# Patient Record
Sex: Male | Born: 1955 | Race: White | Hispanic: No | Marital: Married
Health system: Southern US, Community
[De-identification: ages and names within clinical notes are randomized; demographics above are authoritative.]

---

## 2009-06-24 ENCOUNTER — Inpatient Hospital Stay (HOSPITAL_COMMUNITY): Admission: EM | Admit: 2009-06-24 | Discharge: 2009-06-27 | Payer: Self-pay | Admitting: Emergency Medicine

## 2009-08-05 ENCOUNTER — Encounter: Admission: RE | Admit: 2009-08-05 | Discharge: 2009-08-05 | Payer: Self-pay | Admitting: Internal Medicine

## 2010-08-23 ENCOUNTER — Encounter
Admission: RE | Admit: 2010-08-23 | Discharge: 2010-08-23 | Payer: Self-pay | Source: Home / Self Care | Attending: Internal Medicine | Admitting: Internal Medicine

## 2010-10-23 ENCOUNTER — Emergency Department (HOSPITAL_COMMUNITY): Payer: BC Managed Care – PPO

## 2010-10-23 ENCOUNTER — Emergency Department (HOSPITAL_COMMUNITY)
Admission: EM | Admit: 2010-10-23 | Discharge: 2010-10-23 | Disposition: A | Discharge status: Home / Self Care | Payer: BC Managed Care – PPO | Attending: Emergency Medicine | Admitting: Emergency Medicine

## 2010-10-23 DIAGNOSIS — R109 Unspecified abdominal pain: Secondary | ICD-10-CM | POA: Insufficient documentation

## 2010-10-23 DIAGNOSIS — N201 Calculus of ureter: Secondary | ICD-10-CM | POA: Insufficient documentation

## 2010-10-23 LAB — URINALYSIS, ROUTINE W REFLEX MICROSCOPIC
Leukocytes, UA: NEGATIVE
Protein, ur: NEGATIVE mg/dL
Specific Gravity, Urine: 1.026 (ref 1.005–1.030)
Urobilinogen, UA: 0.2 mg/dL (ref 0.0–1.0)

## 2010-10-23 LAB — DIFFERENTIAL
Basophils Absolute: 0 10*3/uL (ref 0.0–0.1)
Eosinophils Absolute: 0 10*3/uL (ref 0.0–0.7)
Lymphocytes Relative: 4 % — ABNORMAL LOW (ref 12–46)
Lymphs Abs: 0.5 10*3/uL — ABNORMAL LOW (ref 0.7–4.0)
Neutro Abs: 11.7 10*3/uL — ABNORMAL HIGH (ref 1.7–7.7)
Neutrophils Relative %: 92 % — ABNORMAL HIGH (ref 43–77)

## 2010-10-23 LAB — CBC
Hemoglobin: 14.8 g/dL (ref 13.0–17.0)
MCH: 32.9 pg (ref 26.0–34.0)
RBC: 4.5 MIL/uL (ref 4.22–5.81)
WBC: 12.7 10*3/uL — ABNORMAL HIGH (ref 4.0–10.5)

## 2010-10-23 LAB — POCT I-STAT, CHEM 8
Potassium: 4.4 mEq/L (ref 3.5–5.1)
Sodium: 141 mEq/L (ref 135–145)

## 2010-10-23 LAB — URINE MICROSCOPIC-ADD ON

## 2010-12-01 LAB — COMPREHENSIVE METABOLIC PANEL
ALT: 100 U/L — ABNORMAL HIGH (ref 0–53)
AST: 82 U/L — ABNORMAL HIGH (ref 0–37)
Albumin: 3.3 g/dL — ABNORMAL LOW (ref 3.5–5.2)
BUN: 10 mg/dL (ref 6–23)
CO2: 29 mEq/L (ref 19–32)
Calcium: 8 mg/dL — ABNORMAL LOW (ref 8.4–10.5)
Chloride: 103 mEq/L (ref 96–112)
Chloride: 98 mEq/L (ref 96–112)
Creatinine, Ser: 1.28 mg/dL (ref 0.4–1.5)
GFR calc Af Amer: >60 mL/min (ref >60)
GFR calc non Af Amer: 59 mL/min — ABNORMAL LOW (ref >60)
Glucose, Bld: 106 mg/dL — ABNORMAL HIGH (ref 70–99)
Potassium: 3.4 mEq/L — ABNORMAL LOW (ref 3.5–5.1)
Sodium: 139 mEq/L (ref 135–145)
Total Bilirubin: 1.5 mg/dL — ABNORMAL HIGH (ref 0.3–1.2)
Total Protein: 5.9 g/dL — ABNORMAL LOW (ref 6.0–8.3)

## 2010-12-01 LAB — URINALYSIS, ROUTINE W REFLEX MICROSCOPIC
Bilirubin Urine: NEGATIVE
Glucose, UA: NEGATIVE mg/dL
Ketones, ur: NEGATIVE mg/dL
Nitrite: NEGATIVE
Protein, ur: NEGATIVE mg/dL
Urobilinogen, UA: 4 mg/dL — ABNORMAL HIGH (ref 0.0–1.0)

## 2010-12-01 LAB — CBC
HCT: 33.5 % — ABNORMAL LOW (ref 39.0–52.0)
HCT: 34.1 % — ABNORMAL LOW (ref 39.0–52.0)
Hemoglobin: 11.6 g/dL — ABNORMAL LOW (ref 13.0–17.0)
Hemoglobin: 13.2 g/dL (ref 13.0–17.0)
MCHC: 34.1 g/dL (ref 30.0–36.0)
MCV: 96.6 fL (ref 78.0–100.0)
MCV: 97.4 fL (ref 78.0–100.0)
MCV: 97.5 fL (ref 78.0–100.0)
Platelets: 255 10*3/uL (ref 150–400)
RDW: 12.6 % (ref 11.5–15.5)
WBC: 15.2 10*3/uL — ABNORMAL HIGH (ref 4.0–10.5)
WBC: 8.3 10*3/uL (ref 4.0–10.5)

## 2010-12-01 LAB — BASIC METABOLIC PANEL
CO2: 29 mEq/L (ref 19–32)
Calcium: 8.5 mg/dL (ref 8.4–10.5)
Chloride: 102 mEq/L (ref 96–112)
Creatinine, Ser: 1.05 mg/dL (ref 0.4–1.5)
GFR calc Af Amer: >60 mL/min (ref >60)
GFR calc Af Amer: >60 mL/min (ref >60)
GFR calc non Af Amer: >60 mL/min (ref >60)
Sodium: 136 mEq/L (ref 135–145)

## 2010-12-01 LAB — DIFFERENTIAL
Basophils Relative: 0 % (ref 0–1)
Eosinophils Absolute: 0.3 10*3/uL (ref 0.0–0.7)
Eosinophils Relative: 2 % (ref 0–5)
Lymphs Abs: 1.3 10*3/uL (ref 0.7–4.0)

## 2010-12-01 LAB — HEPATIC FUNCTION PANEL
ALT: 69 U/L — ABNORMAL HIGH (ref 0–53)
ALT: 73 U/L — ABNORMAL HIGH (ref 0–53)
Albumin: 3.1 g/dL — ABNORMAL LOW (ref 3.5–5.2)
Alkaline Phosphatase: 115 U/L (ref 39–117)
Alkaline Phosphatase: 155 U/L — ABNORMAL HIGH (ref 39–117)
Bilirubin, Direct: 0.4 mg/dL — ABNORMAL HIGH (ref 0.0–0.3)
Total Bilirubin: 1 mg/dL (ref 0.3–1.2)
Total Protein: 5.8 g/dL — ABNORMAL LOW (ref 6.0–8.3)

## 2011-07-06 ENCOUNTER — Other Ambulatory Visit: Payer: Self-pay | Admitting: Internal Medicine

## 2011-07-06 DIAGNOSIS — E041 Nontoxic single thyroid nodule: Secondary | ICD-10-CM

## 2011-07-31 ENCOUNTER — Ambulatory Visit
Admission: RE | Admit: 2011-07-31 | Discharge: 2011-07-31 | Disposition: A | Discharge status: Home / Self Care | Payer: BC Managed Care – PPO | Source: Ambulatory Visit | Attending: Internal Medicine | Admitting: Internal Medicine

## 2011-07-31 DIAGNOSIS — E041 Nontoxic single thyroid nodule: Secondary | ICD-10-CM

## 2012-03-11 IMAGING — CT CT ABD-PELV W/O CM
2 of 4 series · 17 of 46 positions shown, 19 images · non-contrast
Comparison: None.

CLINICAL DATA: Right flank pain.  Nausea and vomiting.  Hematuria.

CT ABDOMEN AND PELVIS WITHOUT CONTRAST
TECHNIQUE: Multidetector CT imaging of the abdomen and pelvis was
performed following the standard protocol without intravenous
contrast.

[Series 2: stone_wo 5.0 b40f st · axial · 0.75mm/px · z∈[-509,-89]mm · 14 of 92 slices shown, 16 images]
[im 4/92  soft-tissue]
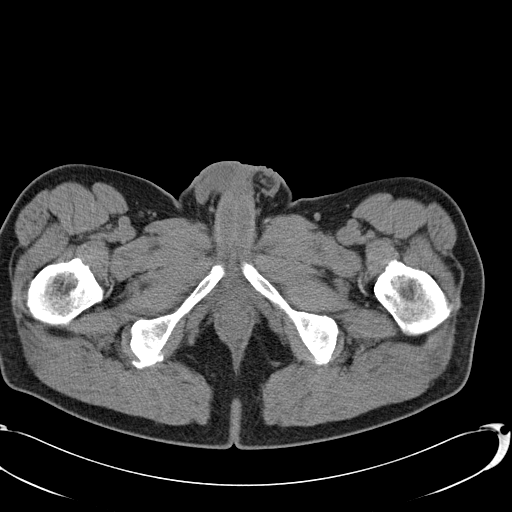
[im 4/92  bone]
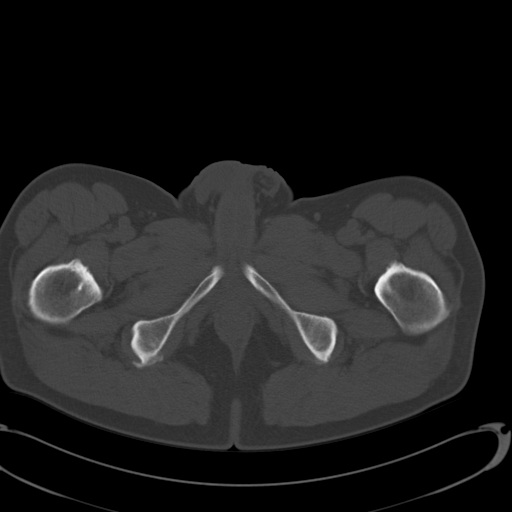
[im 11/92  soft-tissue]
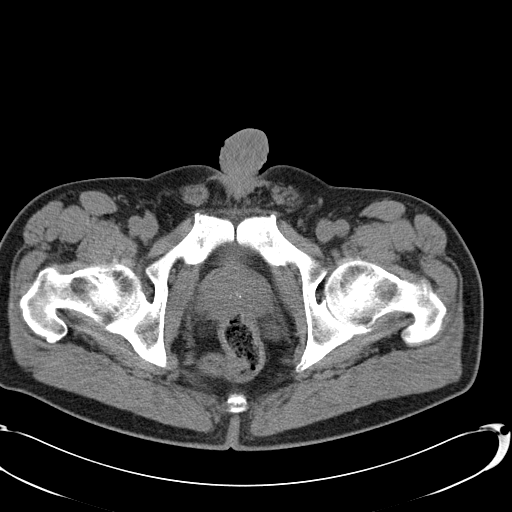
[im 19/92  soft-tissue]
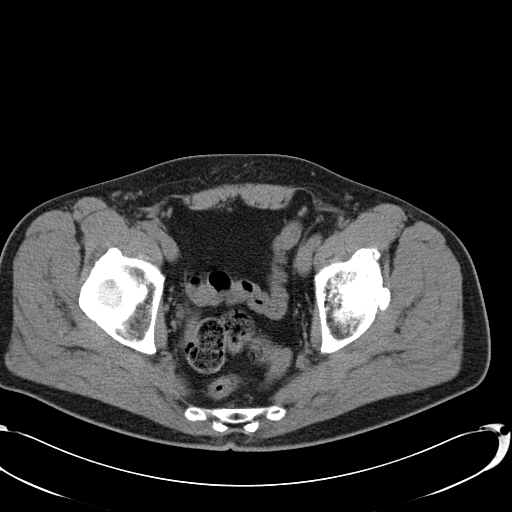
[im 26/92  soft-tissue]
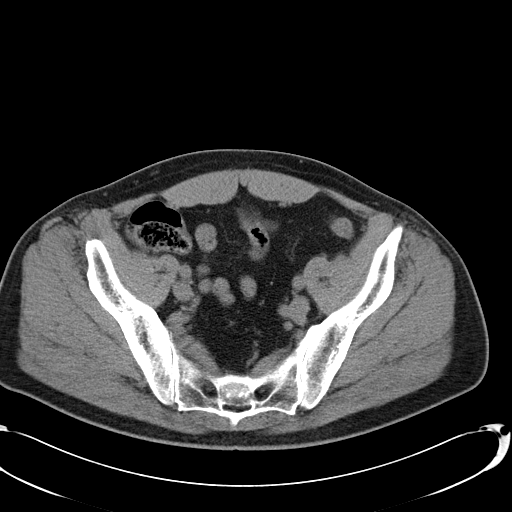
[im 30/92  soft-tissue]
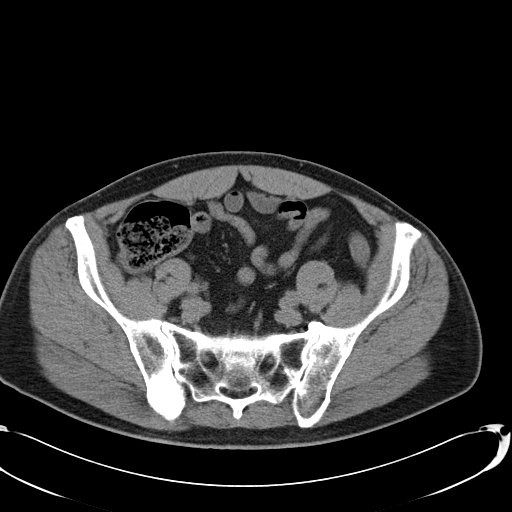
[im 37/92  soft-tissue]
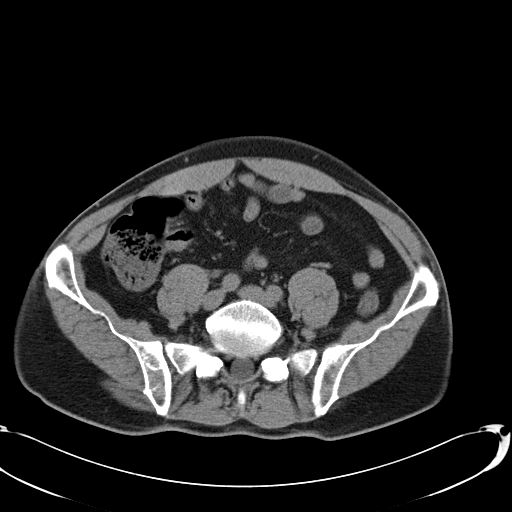
[im 44/92  soft-tissue]
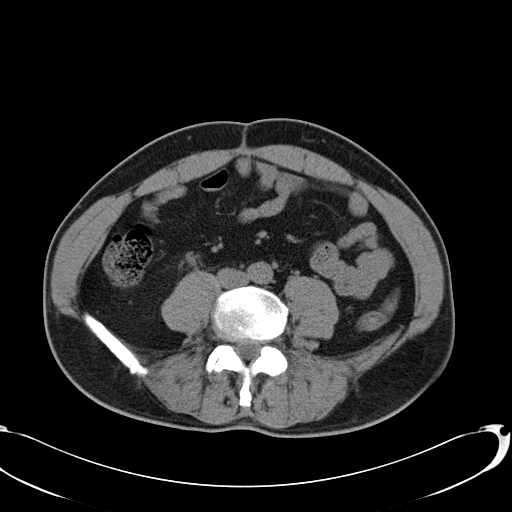
[im 48/92  soft-tissue]
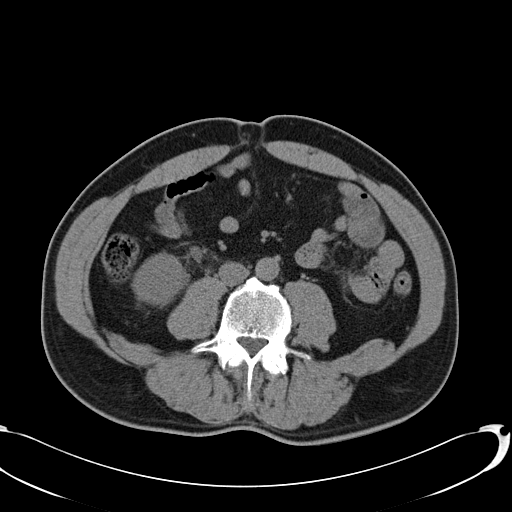
[im 55/92  soft-tissue]
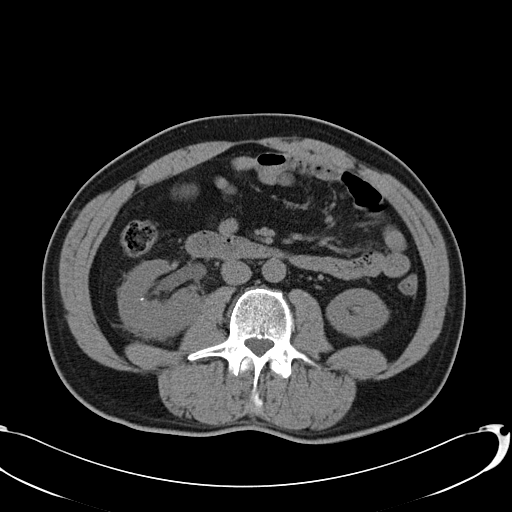
[im 55/92  bone]
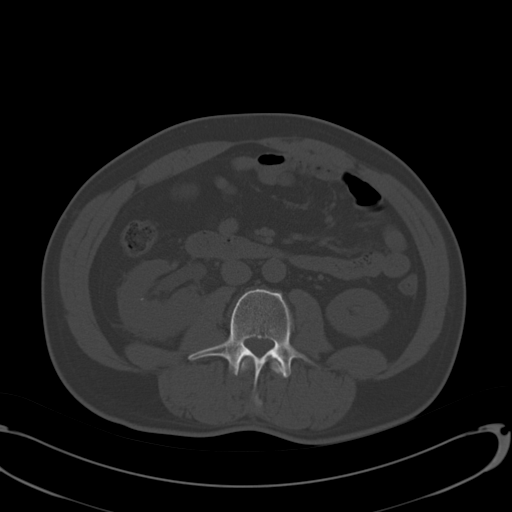
[im 62/92  soft-tissue]
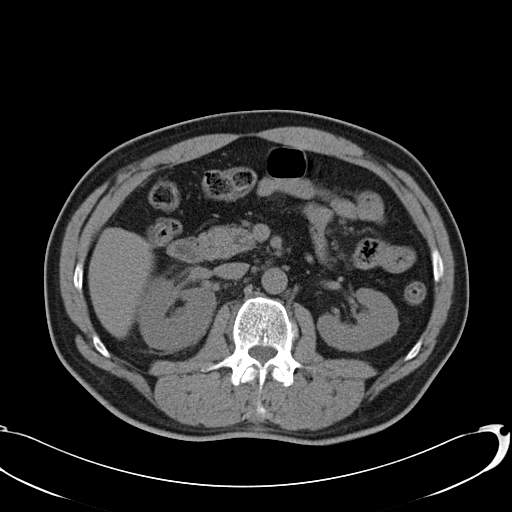
[im 70/92  soft-tissue]
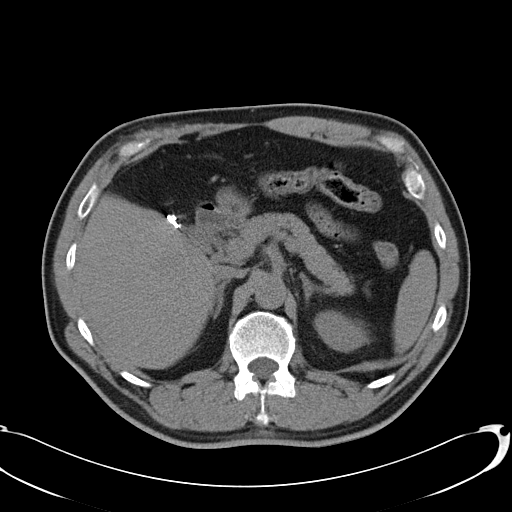
[im 73/92  soft-tissue]
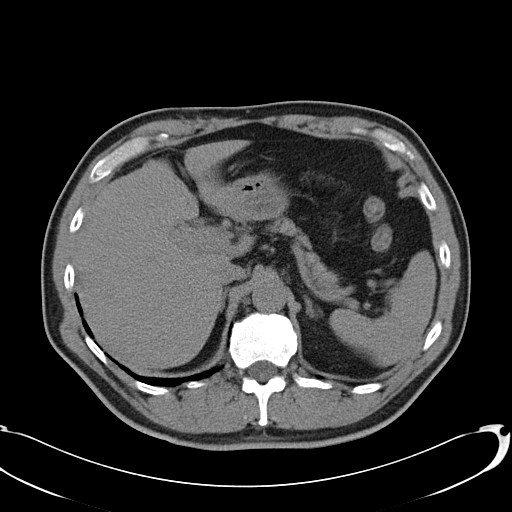
[im 81/92  soft-tissue]
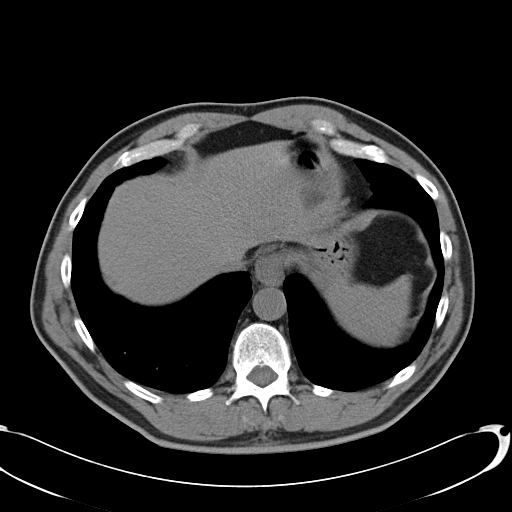
[im 88/92  soft-tissue]
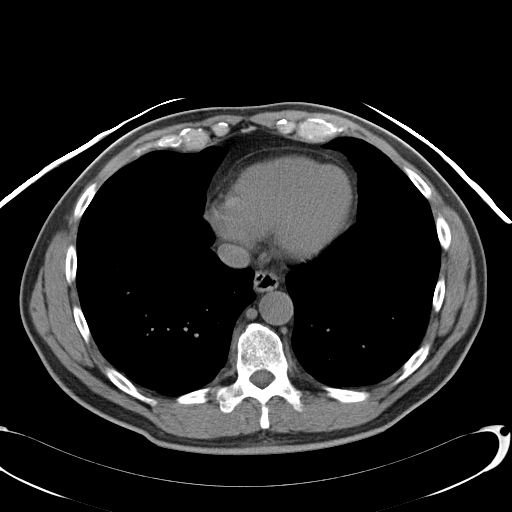

[Series 602: coronal abdomen · coronal · 0.93mm/px · 3 of 127 slices shown]
[im 43/127  soft-tissue]
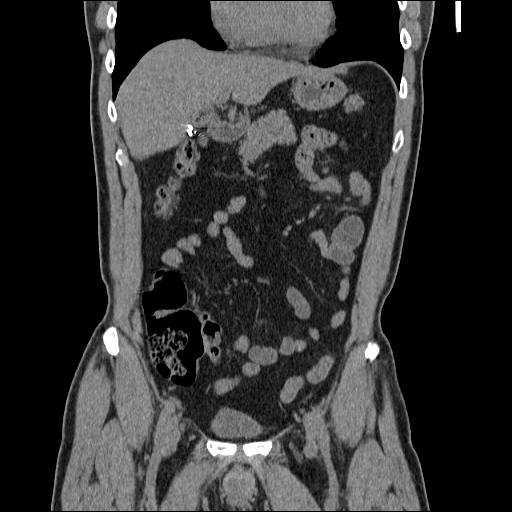
[im 57/127  soft-tissue]
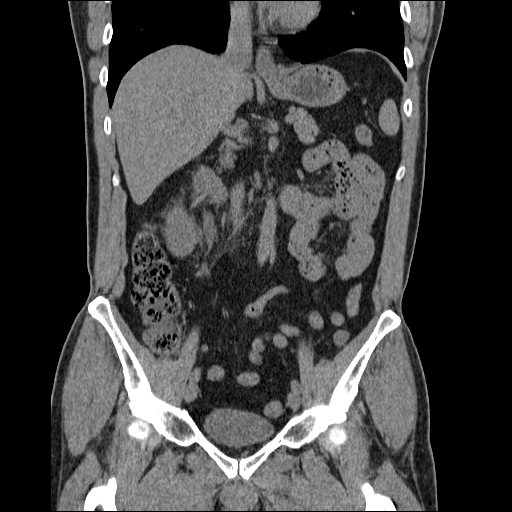
[im 71/127  soft-tissue]
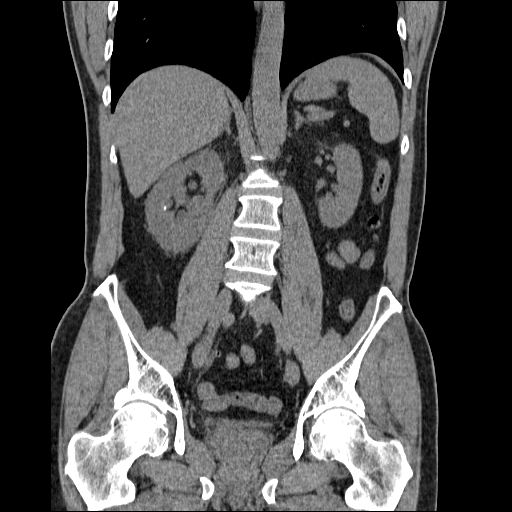

[17 of 46 positions shown; findings below may reference images not displayed]

FINDINGS: 3-day 3 mm calculus is seen at the right ureterovesicle
junction.  This causes mild to moderate right-sided hydronephrosis
and ureterectasis.  Mild right perinephric stranding is also noted.

Tiny 2-3 mm nonobstructing calculi are seen in the mid poles of
both kidneys.  No evidence of left-sided hydronephrosis.  The other
abdominal parenchymal organs have a normal appearance on this
noncontrast study.  Surgical clips are seen from prior
cholecystectomy.

No soft tissue masses or lymphadenopathy are identified.  On this
five bowel is normal in appearance.  Scattered diverticula are seen
in the descending sigmoid colon.  No inflammatory process or
abnormal fluid collections are identified.
IMPRESSION: 1.  Mild  right hydroureteronephrosis due to 3 mm calculus at the
ureterovesicle junction.
2.  Tiny nonobstructing bilateral intrarenal calculi.

## 2014-10-20 ENCOUNTER — Other Ambulatory Visit: Payer: Self-pay | Admitting: Internal Medicine

## 2014-10-20 DIAGNOSIS — E041 Nontoxic single thyroid nodule: Secondary | ICD-10-CM

## 2014-11-03 ENCOUNTER — Other Ambulatory Visit: Payer: Self-pay

## 2014-11-19 ENCOUNTER — Other Ambulatory Visit: Payer: Self-pay

## 2014-11-25 ENCOUNTER — Ambulatory Visit
Admission: RE | Admit: 2014-11-25 | Discharge: 2014-11-25 | Disposition: A | Discharge status: Home / Self Care | Payer: BLUE CROSS/BLUE SHIELD | Source: Ambulatory Visit | Attending: Internal Medicine | Admitting: Internal Medicine

## 2014-11-25 DIAGNOSIS — E041 Nontoxic single thyroid nodule: Secondary | ICD-10-CM

## 2015-09-09 MED FILL — ENBREL 50 MG/ML SURECLICK S: 50 | 28 days supply | Qty: 4 | Fill #0

## 2015-09-21 MED FILL — METHOTREXATE 2.5 MG TABLET: 2.5 | 28 days supply | Qty: 16 | Fill #0

## 2015-10-06 MED FILL — ENBREL 50 MG/ML SURECLICK S: 50 | 28 days supply | Qty: 4 | Fill #1

## 2015-10-21 DIAGNOSIS — Z125 Encounter for screening for malignant neoplasm of prostate: Secondary | ICD-10-CM | POA: Diagnosis not present

## 2015-10-21 DIAGNOSIS — I10 Essential (primary) hypertension: Secondary | ICD-10-CM | POA: Diagnosis not present

## 2015-10-21 DIAGNOSIS — Z7982 Long term (current) use of aspirin: Secondary | ICD-10-CM | POA: Diagnosis not present

## 2015-10-21 DIAGNOSIS — Z0000 Encounter for general adult medical examination without abnormal findings: Secondary | ICD-10-CM | POA: Diagnosis not present

## 2015-10-27 DIAGNOSIS — Z8042 Family history of malignant neoplasm of prostate: Secondary | ICD-10-CM | POA: Diagnosis not present

## 2015-10-27 DIAGNOSIS — Z1212 Encounter for screening for malignant neoplasm of rectum: Secondary | ICD-10-CM | POA: Diagnosis not present

## 2015-10-27 DIAGNOSIS — Z0000 Encounter for general adult medical examination without abnormal findings: Secondary | ICD-10-CM | POA: Diagnosis not present

## 2015-10-27 DIAGNOSIS — E042 Nontoxic multinodular goiter: Secondary | ICD-10-CM | POA: Diagnosis not present

## 2015-10-27 DIAGNOSIS — I10 Essential (primary) hypertension: Secondary | ICD-10-CM | POA: Diagnosis not present

## 2015-10-27 MED FILL — LISINOPRIL-HCTZ 20-12.5 MG: 20-12.5 | 90 days supply | Qty: 90 | Fill #0

## 2015-10-27 MED FILL — ATORVASTATIN 40 MG TABLET: 40 | 90 days supply | Qty: 90 | Fill #0

## 2015-11-02 MED FILL — ENBREL 50 MG/ML SURECLICK S: 50 | 28 days supply | Qty: 4 | Fill #2

## 2015-11-02 MED FILL — METHOTREXATE 2.5 MG TABLET: 2.5 | 28 days supply | Qty: 16 | Fill #1

## 2015-11-10 DIAGNOSIS — M0589 Other rheumatoid arthritis with rheumatoid factor of multiple sites: Secondary | ICD-10-CM | POA: Diagnosis not present

## 2015-11-10 DIAGNOSIS — M7989 Other specified soft tissue disorders: Secondary | ICD-10-CM | POA: Diagnosis not present

## 2015-11-10 DIAGNOSIS — M150 Primary generalized (osteo)arthritis: Secondary | ICD-10-CM | POA: Diagnosis not present

## 2015-11-10 MED FILL — FOLIC ACID 1 MG TABLET: 1 | 90 days supply | Qty: 90 | Fill #0

## 2015-11-29 MED FILL — METHOTREXATE 2.5 MG TABLET: 2.5 | 28 days supply | Qty: 16 | Fill #2

## 2015-11-29 MED FILL — ENBREL 50 MG/ML SURECLICK S: 50 | 28 days supply | Qty: 4 | Fill #3

## 2015-12-20 MED FILL — CIPROFLOXACIN HCL 500 MG TA: 500 | 14 days supply | Qty: 28 | Fill #0

## 2015-12-27 MED FILL — METHOTREXATE 2.5 MG TABLET: 2.5 | 90 days supply | Qty: 48 | Fill #0

## 2015-12-27 MED FILL — ENBREL 50 MG/ML SURECLICK S: 50 | 28 days supply | Qty: 4 | Fill #4

## 2016-01-31 MED FILL — LISINOPRIL-HCTZ 20-12.5 MG: 20-12.5 | 90 days supply | Qty: 90 | Fill #1

## 2016-01-31 MED FILL — ENBREL 50 MG/ML SURECLICK S: 50 | 28 days supply | Qty: 4 | Fill #5

## 2016-02-04 ENCOUNTER — Telehealth: Payer: Self-pay | Admitting: Pharmacist

## 2016-02-04 NOTE — Telephone Encounter (Signed)
Called patient to schedule appointment with the [facility]. Unable to reach patient but left a HIPAA-compliant message requesting that he return my call.

## 2016-02-09 ENCOUNTER — Ambulatory Visit: Payer: 59 | Attending: Internal Medicine | Admitting: Pharmacist

## 2016-02-09 DIAGNOSIS — M069 Rheumatoid arthritis, unspecified: Secondary | ICD-10-CM

## 2016-02-09 NOTE — Progress Notes (Signed)
S: Patient presents today to the [facility].  Patient is currently taking Enbrel for rheumatoid arthritis. Patient is managed by Dr. Dierdre Forth for this.   Adherence: denies any missed doses  Dosing: 50 mg subQ weekly and rotates sites. Patient also on methotrexate and folic acid.   Drug-drug interactions: none  Monitoring: CBC: monitored every 3 months by Dr. Dierdre Forth S/sx of infection: denies Injection site reactions: denies S/sx of malignancy: denies   O:     Lab Results  Component Value Date   WBC 12.7* 10/23/2010   HGB 15.0 10/23/2010   HCT 44.0 10/23/2010   MCV 95.1 10/23/2010   PLT  10/23/2010    PLATELET CLUMPS NOTED ON SMEAR, COUNT APPEARS ADEQUATE      Chemistry      Component Value Date/Time   NA 141 10/23/2010 1320   K 4.4 10/23/2010 1320   CL 107 10/23/2010 1320   CO2 30 06/27/2009 0410   BUN 24* 10/23/2010 1320   CREATININE 1.6* 10/23/2010 1320      Component Value Date/Time   CALCIUM 8.0* 06/27/2009 0410   ALKPHOS 111 06/27/2009 0410   AST 88* 06/27/2009 0410   ALT 111* 06/27/2009 0410   BILITOT 1.0 06/27/2009 0410     Reviewed labs through [facility] - Hgb/HCT normal, LFTs normal, SCr 1.2 (Feb 2017)  A/P: 1. Medication review: patient is tolerating Enbrel well with no adverse effects and no recent flares. Reviewed Enbrel with him, including the increased risk of infections, decrease in blood cell counts, possible risk of malignancy (unsure if this is due to the drug or due to the disease state), and injection site reactions. No recommendations for changes.    Juanita Craver, PharmD, BCPS, CPP Clinical Pharmacist Practitioner  [facility] [phone]

## 2016-02-10 DIAGNOSIS — M0589 Other rheumatoid arthritis with rheumatoid factor of multiple sites: Secondary | ICD-10-CM | POA: Diagnosis not present

## 2016-02-28 MED FILL — FOLIC ACID 1 MG TABLET: 1 | 90 days supply | Qty: 90 | Fill #1

## 2016-03-01 ENCOUNTER — Other Ambulatory Visit: Payer: Self-pay | Admitting: Pharmacist

## 2016-03-01 MED ORDER — ETANERCEPT 50 MG/ML ~~LOC~~ SOAJ: 50.0000 mg | SUBCUTANEOUS | Status: DC

## 2016-03-01 MED FILL — ENBREL 50 MG/ML SURECLICK S: 50 | 28 days supply | Qty: 4 | Fill #0

## 2016-03-27 MED FILL — ENBREL 50 MG/ML SURECLICK S: 50 | 28 days supply | Qty: 4 | Fill #1

## 2016-03-27 MED FILL — METHOTREXATE 2.5 MG TABLET: 2.5 | 90 days supply | Qty: 48 | Fill #1

## 2016-04-25 MED FILL — ENBREL 50 MG/ML SURECLICK S: 50 | 28 days supply | Qty: 4 | Fill #2

## 2016-05-15 ENCOUNTER — Other Ambulatory Visit: Payer: Self-pay | Admitting: Pharmacist

## 2016-05-15 MED ORDER — ETANERCEPT 50 MG/ML ~~LOC~~ SOAJ: SUBCUTANEOUS | 2 refills | Status: DC

## 2016-05-15 MED ORDER — ETANERCEPT 50 MG/ML ~~LOC~~ SOAJ: SUBCUTANEOUS | 1 refills | Status: DC

## 2016-05-15 MED FILL — ENBREL 50 MG/ML SURECLICK S: 50 | 28 days supply | Qty: 4 | Fill #0

## 2016-05-17 DIAGNOSIS — M150 Primary generalized (osteo)arthritis: Secondary | ICD-10-CM | POA: Diagnosis not present

## 2016-05-17 DIAGNOSIS — M0589 Other rheumatoid arthritis with rheumatoid factor of multiple sites: Secondary | ICD-10-CM | POA: Diagnosis not present

## 2016-05-17 MED FILL — FOLIC ACID 1 MG TABLET: 1 | 90 days supply | Qty: 90 | Fill #2

## 2016-05-17 MED FILL — LISINOPRIL-HCTZ 20-12.5 MG: 20-12.5 | 90 days supply | Qty: 90 | Fill #2

## 2016-06-07 DIAGNOSIS — M545 Low back pain: Secondary | ICD-10-CM | POA: Diagnosis not present

## 2016-06-07 DIAGNOSIS — Z23 Encounter for immunization: Secondary | ICD-10-CM | POA: Diagnosis not present

## 2016-06-23 MED FILL — ENBREL 50 MG/ML SURECLICK S: 50 | 28 days supply | Qty: 4 | Fill #1

## 2016-06-23 MED FILL — METHOTREXATE 2.5 MG TABLET: 2.5 | 90 days supply | Qty: 48 | Fill #2

## 2016-07-17 MED FILL — ENBREL 50 MG/ML SURECLICK S: 50 | 28 days supply | Qty: 4 | Fill #2

## 2016-08-09 MED FILL — LISINOPRIL-HCTZ 20-12.5 MG: 20-12.5 | 90 days supply | Qty: 90 | Fill #3

## 2016-08-15 ENCOUNTER — Other Ambulatory Visit: Payer: Self-pay | Admitting: Pharmacist

## 2016-08-15 MED ORDER — ETANERCEPT 50 MG/ML ~~LOC~~ SOAJ: SUBCUTANEOUS | 5 refills | Status: DC

## 2016-08-15 MED FILL — ENBREL 50 MG/ML SURECLICK S: 50 | 28 days supply | Qty: 4 | Fill #0

## 2016-08-16 DIAGNOSIS — M0589 Other rheumatoid arthritis with rheumatoid factor of multiple sites: Secondary | ICD-10-CM | POA: Diagnosis not present

## 2016-09-12 MED FILL — FOLIC ACID 1 MG TABLET: 1 | 90 days supply | Qty: 90 | Fill #3

## 2016-09-12 MED FILL — ENBREL 50 MG/ML SURECLICK S: 50 | 28 days supply | Qty: 4 | Fill #1

## 2016-09-12 MED FILL — METHOTREXATE 2.5 MG TABLET: 2.5 | 90 days supply | Qty: 48 | Fill #3

## 2016-10-09 MED FILL — ATORVASTATIN 40 MG TABLET: 40 | 90 days supply | Qty: 90 | Fill #1

## 2016-10-09 MED FILL — ENBREL 50 MG/ML SURECLICK S: 50 | 28 days supply | Qty: 4 | Fill #2

## 2016-11-02 DIAGNOSIS — Z125 Encounter for screening for malignant neoplasm of prostate: Secondary | ICD-10-CM | POA: Diagnosis not present

## 2016-11-02 DIAGNOSIS — Z0000 Encounter for general adult medical examination without abnormal findings: Secondary | ICD-10-CM | POA: Diagnosis not present

## 2016-11-06 MED FILL — LISINOPRIL-HCTZ 20-12.5 MG: 20-12.5 | 90 days supply | Qty: 90 | Fill #0

## 2016-11-06 MED FILL — ENBREL 50 MG/ML SURECLICK S: 50 | 28 days supply | Qty: 4 | Fill #3

## 2016-11-08 DIAGNOSIS — N200 Calculus of kidney: Secondary | ICD-10-CM | POA: Diagnosis not present

## 2016-11-08 DIAGNOSIS — Z1212 Encounter for screening for malignant neoplasm of rectum: Secondary | ICD-10-CM | POA: Diagnosis not present

## 2016-11-08 DIAGNOSIS — Z7982 Long term (current) use of aspirin: Secondary | ICD-10-CM | POA: Diagnosis not present

## 2016-11-08 DIAGNOSIS — M069 Rheumatoid arthritis, unspecified: Secondary | ICD-10-CM | POA: Diagnosis not present

## 2016-11-08 DIAGNOSIS — Z0001 Encounter for general adult medical examination with abnormal findings: Secondary | ICD-10-CM | POA: Diagnosis not present

## 2016-11-14 DIAGNOSIS — Z6825 Body mass index (BMI) 25.0-25.9, adult: Secondary | ICD-10-CM | POA: Diagnosis not present

## 2016-11-14 DIAGNOSIS — E663 Overweight: Secondary | ICD-10-CM | POA: Diagnosis not present

## 2016-11-14 DIAGNOSIS — M150 Primary generalized (osteo)arthritis: Secondary | ICD-10-CM | POA: Diagnosis not present

## 2016-11-14 DIAGNOSIS — M0589 Other rheumatoid arthritis with rheumatoid factor of multiple sites: Secondary | ICD-10-CM | POA: Diagnosis not present

## 2016-12-04 MED FILL — METHOTREXATE 2.5 MG TABLET: 2.5 | 52 days supply | Qty: 52 | Fill #0

## 2016-12-04 MED FILL — ENBREL 50 MG/ML SURECLICK S: 50 | 28 days supply | Qty: 4 | Fill #4

## 2016-12-04 MED FILL — FOLIC ACID 1 MG TABLET: 1 | 90 days supply | Qty: 90 | Fill #0

## 2016-12-27 DIAGNOSIS — N390 Urinary tract infection, site not specified: Secondary | ICD-10-CM | POA: Diagnosis not present

## 2016-12-27 DIAGNOSIS — N410 Acute prostatitis: Secondary | ICD-10-CM | POA: Diagnosis not present

## 2016-12-27 DIAGNOSIS — R3915 Urgency of urination: Secondary | ICD-10-CM | POA: Diagnosis not present

## 2016-12-27 DIAGNOSIS — R358 Other polyuria: Secondary | ICD-10-CM | POA: Diagnosis not present

## 2016-12-27 MED FILL — CIPROFLOXACIN HCL 500 MG TA: 500 | 30 days supply | Qty: 60 | Fill #0

## 2017-01-08 MED FILL — ENBREL 50 MG/ML SURECLICK S: 50 | 28 days supply | Qty: 4 | Fill #5

## 2017-02-05 ENCOUNTER — Other Ambulatory Visit: Payer: Self-pay | Admitting: Pharmacist

## 2017-02-05 MED ORDER — ETANERCEPT 50 MG/ML ~~LOC~~ SOAJ: SUBCUTANEOUS | 5 refills | Status: DC

## 2017-02-05 MED FILL — ENBREL 50 MG/ML SURECLICK S: 50 | 28 days supply | Qty: 4 | Fill #0

## 2017-02-05 MED FILL — LISINOPRIL-HCTZ 20-12.5 MG: 20-12.5 | 90 days supply | Qty: 90 | Fill #1

## 2017-02-14 DIAGNOSIS — M0589 Other rheumatoid arthritis with rheumatoid factor of multiple sites: Secondary | ICD-10-CM | POA: Diagnosis not present

## 2017-03-05 MED FILL — METHOTREXATE 2.5 MG TABLET: 2.5 | 52 days supply | Qty: 52 | Fill #1

## 2017-03-05 MED FILL — FOLIC ACID 1 MG TABLET: 1 | 90 days supply | Qty: 90 | Fill #1

## 2017-03-06 ENCOUNTER — Other Ambulatory Visit: Payer: Self-pay | Admitting: Pharmacist

## 2017-03-06 MED ORDER — ETANERCEPT 50 MG/ML ~~LOC~~ SOAJ: SUBCUTANEOUS | 5 refills | Status: DC

## 2017-03-06 MED FILL — ENBREL 50 MG/ML SURECLICK S: 50 | 28 days supply | Qty: 4 | Fill #1

## 2017-04-02 MED FILL — ENBREL 50 MG/ML SURECLICK S: 50 | 28 days supply | Qty: 4 | Fill #2

## 2017-05-01 MED FILL — LISINOPRIL-HCTZ 20-12.5 MG: 20-12.5 | 90 days supply | Qty: 90 | Fill #2

## 2017-05-01 MED FILL — ENBREL 50 MG/ML SURECLICK S: 50 | 28 days supply | Qty: 4 | Fill #3

## 2017-05-02 MED FILL — ATORVASTATIN 40 MG TABLET: 40 | 90 days supply | Qty: 45 | Fill #0

## 2017-05-17 DIAGNOSIS — M0589 Other rheumatoid arthritis with rheumatoid factor of multiple sites: Secondary | ICD-10-CM | POA: Diagnosis not present

## 2017-05-17 DIAGNOSIS — Z6824 Body mass index (BMI) 24.0-24.9, adult: Secondary | ICD-10-CM | POA: Diagnosis not present

## 2017-05-17 DIAGNOSIS — M150 Primary generalized (osteo)arthritis: Secondary | ICD-10-CM | POA: Diagnosis not present

## 2017-05-28 MED FILL — METHOTREXATE 2.5 MG TABLET: 2.5 | 52 days supply | Qty: 52 | Fill #2

## 2017-05-28 MED FILL — ENBREL 50 MG/ML SURECLICK S: 50 | 28 days supply | Qty: 4 | Fill #4

## 2017-05-28 MED FILL — FOLIC ACID 1 MG TABLET: 1 | 90 days supply | Qty: 90 | Fill #2

## 2017-06-25 MED FILL — ENBREL 50 MG/ML SURECLICK S: 50 | 28 days supply | Qty: 4 | Fill #5

## 2017-07-23 MED FILL — LISINOPRIL-HCTZ 20-12.5 MG: 20-12.5 | 90 days supply | Qty: 90 | Fill #3

## 2017-07-24 ENCOUNTER — Other Ambulatory Visit: Payer: Self-pay | Admitting: Pharmacist

## 2017-07-24 MED ORDER — ETANERCEPT 50 MG/ML ~~LOC~~ SOAJ: SUBCUTANEOUS | 5 refills | Status: DC

## 2017-07-24 MED FILL — ENBREL 50 MG/ML SURECLICK S: 50 | 28 days supply | Qty: 4 | Fill #0

## 2017-08-13 MED FILL — ATORVASTATIN 40 MG TABLET: 40 | 90 days supply | Qty: 45 | Fill #1

## 2017-08-14 MED FILL — ENBREL 50 MG/ML SURECLICK S: 50 | 28 days supply | Qty: 4 | Fill #1

## 2017-08-16 DIAGNOSIS — M0589 Other rheumatoid arthritis with rheumatoid factor of multiple sites: Secondary | ICD-10-CM | POA: Diagnosis not present

## 2017-09-12 MED FILL — FOLIC ACID 1 MG TABLET: 1 | 90 days supply | Qty: 90 | Fill #0

## 2017-09-12 MED FILL — METHOTREXATE 2.5 MG TABLET: 2.5 | 90 days supply | Qty: 52 | Fill #0

## 2017-09-19 MED FILL — ENBREL 50 MG/ML SURECLICK S: 50 | 28 days supply | Qty: 4 | Fill #2

## 2017-10-23 MED FILL — ENBREL 50 MG/ML SURECLICK S: 50 | 28 days supply | Qty: 4 | Fill #3

## 2017-10-23 MED FILL — LISINOPRIL-HCTZ 20-12.5 MG: 20-12.5 | 90 days supply | Qty: 90 | Fill #0

## 2017-11-08 DIAGNOSIS — Z125 Encounter for screening for malignant neoplasm of prostate: Secondary | ICD-10-CM | POA: Diagnosis not present

## 2017-11-08 DIAGNOSIS — I10 Essential (primary) hypertension: Secondary | ICD-10-CM | POA: Diagnosis not present

## 2017-11-08 DIAGNOSIS — Z7289 Other problems related to lifestyle: Secondary | ICD-10-CM | POA: Diagnosis not present

## 2017-11-08 DIAGNOSIS — Z0000 Encounter for general adult medical examination without abnormal findings: Secondary | ICD-10-CM | POA: Diagnosis not present

## 2017-11-08 DIAGNOSIS — Z1159 Encounter for screening for other viral diseases: Secondary | ICD-10-CM | POA: Diagnosis not present

## 2017-11-08 DIAGNOSIS — E7800 Pure hypercholesterolemia, unspecified: Secondary | ICD-10-CM | POA: Diagnosis not present

## 2017-11-14 DIAGNOSIS — M1990 Unspecified osteoarthritis, unspecified site: Secondary | ICD-10-CM | POA: Diagnosis not present

## 2017-11-14 DIAGNOSIS — Z23 Encounter for immunization: Secondary | ICD-10-CM | POA: Diagnosis not present

## 2017-11-14 DIAGNOSIS — K4020 Bilateral inguinal hernia, without obstruction or gangrene, not specified as recurrent: Secondary | ICD-10-CM | POA: Diagnosis not present

## 2017-11-14 DIAGNOSIS — E7800 Pure hypercholesterolemia, unspecified: Secondary | ICD-10-CM | POA: Diagnosis not present

## 2017-11-14 DIAGNOSIS — Z7982 Long term (current) use of aspirin: Secondary | ICD-10-CM | POA: Diagnosis not present

## 2017-11-14 DIAGNOSIS — M069 Rheumatoid arthritis, unspecified: Secondary | ICD-10-CM | POA: Diagnosis not present

## 2017-11-14 DIAGNOSIS — N200 Calculus of kidney: Secondary | ICD-10-CM | POA: Diagnosis not present

## 2017-11-14 DIAGNOSIS — Z0000 Encounter for general adult medical examination without abnormal findings: Secondary | ICD-10-CM | POA: Diagnosis not present

## 2017-11-14 DIAGNOSIS — E042 Nontoxic multinodular goiter: Secondary | ICD-10-CM | POA: Diagnosis not present

## 2017-11-14 MED FILL — ENBREL 50 MG/ML SURECLICK S: 50 | 28 days supply | Qty: 4 | Fill #4

## 2017-11-14 MED FILL — ATORVASTATIN 40 MG TABLET: 40 | 90 days supply | Qty: 45 | Fill #2

## 2017-11-15 DIAGNOSIS — M150 Primary generalized (osteo)arthritis: Secondary | ICD-10-CM | POA: Diagnosis not present

## 2017-11-15 DIAGNOSIS — M0589 Other rheumatoid arthritis with rheumatoid factor of multiple sites: Secondary | ICD-10-CM | POA: Diagnosis not present

## 2017-11-15 DIAGNOSIS — Z6824 Body mass index (BMI) 24.0-24.9, adult: Secondary | ICD-10-CM | POA: Diagnosis not present

## 2017-12-10 MED FILL — METHOTREXATE 2.5 MG TABLET: 2.5 | 90 days supply | Qty: 52 | Fill #1

## 2017-12-10 MED FILL — FOLIC ACID 1 MG TABLET: 1 | 90 days supply | Qty: 90 | Fill #1

## 2017-12-10 MED FILL — ENBREL 50 MG/ML SURECLICK S: 50 | 28 days supply | Qty: 4 | Fill #5

## 2018-01-07 ENCOUNTER — Telehealth: Payer: Self-pay | Admitting: Pharmacist

## 2018-01-07 MED FILL — LISINOPRIL-HCTZ 20-12.5 MG: 20-12.5 | 90 days supply | Qty: 90 | Fill #1

## 2018-01-07 NOTE — Telephone Encounter (Signed)
Patient returned call and left VM. Called patient back and still unable to reach him. Will attempt again later this afternoon.

## 2018-01-07 NOTE — Telephone Encounter (Signed)
Called patient to schedule an appointment for the [facility]. I was unable to reach the patient so I left a HIPAA-compliant message requesting that the patient return my call.   

## 2018-01-07 NOTE — Telephone Encounter (Signed)
Called patient to schedule an appointment for the [facility]. I was unable to reach the patient so I left a HIPAA-compliant message requesting that the patient return my call.   

## 2018-01-08 ENCOUNTER — Ambulatory Visit (INDEPENDENT_AMBULATORY_CARE_PROVIDER_SITE_OTHER): Payer: BLUE CROSS/BLUE SHIELD | Admitting: Pharmacist

## 2018-01-08 DIAGNOSIS — Z79899 Other long term (current) drug therapy: Secondary | ICD-10-CM

## 2018-01-08 MED ORDER — ETANERCEPT 50 MG/ML ~~LOC~~ SOAJ: SUBCUTANEOUS | 5 refills | Status: DC

## 2018-01-08 MED FILL — ENBREL 50 MG/ML SURECLICK S: 50 | 28 days supply | Qty: 4 | Fill #0

## 2018-01-08 NOTE — Progress Notes (Signed)
S: Patient presents today for review of his specialty medication.  Patient is currently taking Enbrel for rheumatoid arthritis. Patient is managed by Dr. Dierdre Forth for this.   Adherence: denies any missed doses  Efficacy: feels like the medication is still working well. Denies any recent flares.  Dosing: 50 mg subQ weekly and rotates sites. Patient still on methotrexate and folic acid.   Drug-drug interactions: none  Monitoring: CBC: monitored every 3 months by Dr. Dierdre Forth, denies any abnormal labs S/sx of infection: denies Injection site reactions: denies S/sx of malignancy: denies  Overall, patient feels like the Enbrel is working well and he is happy with it.   O:     Lab Results  Component Value Date   WBC 12.7 (H) 10/23/2010   HGB 15.0 10/23/2010   HCT 44.0 10/23/2010   MCV 95.1 10/23/2010   PLT  10/23/2010    PLATELET CLUMPS NOTED ON SMEAR, COUNT APPEARS ADEQUATE      Chemistry      Component Value Date/Time   NA 141 10/23/2010 1320   K 4.4 10/23/2010 1320   CL 107 10/23/2010 1320   CO2 30 06/27/2009 0410   BUN 24 (H) 10/23/2010 1320   CREATININE 1.6 (H) 10/23/2010 1320      Component Value Date/Time   CALCIUM 8.0 (L) 06/27/2009 0410   ALKPHOS 111 06/27/2009 0410   AST 88 (H) 06/27/2009 0410   ALT 111 (H) 06/27/2009 0410   BILITOT 1.0 06/27/2009 0410       A/P: 1. Medication review: patient is tolerating Enbrel well with no adverse effects and no recent flares. Reviewed Enbrel with him, including the increased risk of infections, decrease in blood cell counts, possible risk of malignancy (unsure if this is due to the drug or due to the disease state), and injection site reactions. No recommendations for changes.    Alvino Blood, PharmD, BCPS, BCACP, CPP Clinical Pharmacist Practitioner  [phone]

## 2018-02-04 MED FILL — ATORVASTATIN 40 MG TABLET: 40 | 90 days supply | Qty: 45 | Fill #3

## 2018-02-04 MED FILL — ENBREL 50 MG/ML SURECLICK S: 50 | 28 days supply | Qty: 4 | Fill #1

## 2018-02-19 DIAGNOSIS — M0589 Other rheumatoid arthritis with rheumatoid factor of multiple sites: Secondary | ICD-10-CM | POA: Diagnosis not present

## 2018-03-04 MED FILL — ENBREL 50 MG/ML SURECLICK S: 50 | 28 days supply | Qty: 4 | Fill #2

## 2018-03-04 MED FILL — METHOTREXATE 2.5 MG TABLET: 2.5 | 90 days supply | Qty: 52 | Fill #2

## 2018-03-04 MED FILL — FOLIC ACID 1 MG TABS: 1 | 90 days supply | Qty: 90 | Fill #2

## 2018-03-25 MED FILL — ENBREL 50 MG/ML SURECLICK S: 50 | 28 days supply | Qty: 4 | Fill #3

## 2018-04-22 MED FILL — ENBREL 50 MG/ML SURECLICK S: 50 | 28 days supply | Qty: 4 | Fill #4

## 2018-04-22 MED FILL — LISINOPRIL-HCTZ 20-12.5 MG: 20-12.5 | 90 days supply | Qty: 90 | Fill #2

## 2018-05-22 DIAGNOSIS — M0589 Other rheumatoid arthritis with rheumatoid factor of multiple sites: Secondary | ICD-10-CM | POA: Diagnosis not present

## 2018-05-22 DIAGNOSIS — Z6824 Body mass index (BMI) 24.0-24.9, adult: Secondary | ICD-10-CM | POA: Diagnosis not present

## 2018-05-22 DIAGNOSIS — M150 Primary generalized (osteo)arthritis: Secondary | ICD-10-CM | POA: Diagnosis not present

## 2018-05-27 MED FILL — ENBREL SURECLICK 50 MG/ML S: 50 | 28 days supply | Qty: 4 | Fill #5

## 2018-05-27 MED FILL — ATORVASTATIN 40 MG TABLET: 40 | 90 days supply | Qty: 45 | Fill #0

## 2018-06-24 ENCOUNTER — Other Ambulatory Visit: Payer: Self-pay | Admitting: Internal Medicine

## 2018-06-24 ENCOUNTER — Other Ambulatory Visit: Payer: Self-pay | Admitting: Pharmacist

## 2018-06-24 MED ORDER — ETANERCEPT 50 MG/ML ~~LOC~~ SOAJ: SUBCUTANEOUS | 5 refills | Status: DC

## 2018-06-24 MED FILL — METHOTREXATE 2.5 MG TABLET: 2.5 | 90 days supply | Qty: 52 | Fill #0

## 2018-06-24 MED FILL — FOLIC ACID 1 MG TABS: 1 | 90 days supply | Qty: 90 | Fill #0

## 2018-06-24 MED FILL — ENBREL SURECLICK 50 MG/ML S: 50 | 28 days supply | Qty: 4 | Fill #0

## 2018-07-22 MED FILL — LISINOPRIL-HCTZ 20-12.5 MG: 20-12.5 | 90 days supply | Qty: 90 | Fill #3

## 2018-07-22 MED FILL — ENBREL SURECLICK 50 MG/ML S: 50 | 28 days supply | Qty: 4 | Fill #1

## 2018-08-14 DIAGNOSIS — M0589 Other rheumatoid arthritis with rheumatoid factor of multiple sites: Secondary | ICD-10-CM | POA: Diagnosis not present

## 2018-08-19 MED FILL — ATORVASTATIN 40 MG TABLET: 40 | 90 days supply | Qty: 45 | Fill #1

## 2018-08-29 MED FILL — ENBREL SURECLICK 50 MG/ML S: 50 | 28 days supply | Qty: 4 | Fill #2

## 2018-09-16 MED FILL — METHOTREXATE 2.5 MG TABLET: 2.5 | 90 days supply | Qty: 52 | Fill #1

## 2018-09-16 MED FILL — FOLIC ACID 1 MG TABS: 1 | 90 days supply | Qty: 90 | Fill #1

## 2018-09-19 MED FILL — ENBREL SURECLICK 50 MG/ML S: 50 | 28 days supply | Qty: 4 | Fill #3

## 2018-10-21 MED FILL — LISINOPRIL-HCTZ 20-12.5 MG: 20-12.5 | 90 days supply | Qty: 90 | Fill #0

## 2018-10-21 MED FILL — ENBREL SURECLICK 50 MG/ML S: 50 | 28 days supply | Qty: 4 | Fill #4 | Status: TO

## 2018-11-14 DIAGNOSIS — Z125 Encounter for screening for malignant neoplasm of prostate: Secondary | ICD-10-CM | POA: Diagnosis not present

## 2018-11-14 DIAGNOSIS — Z0000 Encounter for general adult medical examination without abnormal findings: Secondary | ICD-10-CM | POA: Diagnosis not present

## 2018-11-19 DIAGNOSIS — M1990 Unspecified osteoarthritis, unspecified site: Secondary | ICD-10-CM | POA: Diagnosis not present

## 2018-11-19 DIAGNOSIS — Z8042 Family history of malignant neoplasm of prostate: Secondary | ICD-10-CM | POA: Diagnosis not present

## 2018-11-19 DIAGNOSIS — Z79899 Other long term (current) drug therapy: Secondary | ICD-10-CM | POA: Diagnosis not present

## 2018-11-19 DIAGNOSIS — R972 Elevated prostate specific antigen [PSA]: Secondary | ICD-10-CM | POA: Diagnosis not present

## 2018-11-19 DIAGNOSIS — E7800 Pure hypercholesterolemia, unspecified: Secondary | ICD-10-CM | POA: Diagnosis not present

## 2018-11-19 DIAGNOSIS — I10 Essential (primary) hypertension: Secondary | ICD-10-CM | POA: Diagnosis not present

## 2018-11-19 DIAGNOSIS — Z87438 Personal history of other diseases of male genital organs: Secondary | ICD-10-CM | POA: Diagnosis not present

## 2018-11-19 MED FILL — ENBREL SURECLICK 50 MG/ML S: 50 | 28 days supply | Qty: 4 | Fill #0

## 2018-11-19 MED FILL — CIPROFLOXACIN HCL 500 MG TA: 500 | 21 days supply | Qty: 42 | Fill #0

## 2018-11-19 MED FILL — ATORVASTATIN 40 MG TABLET: 40 | 90 days supply | Qty: 45 | Fill #0

## 2018-11-20 DIAGNOSIS — M150 Primary generalized (osteo)arthritis: Secondary | ICD-10-CM | POA: Diagnosis not present

## 2018-11-20 DIAGNOSIS — M0589 Other rheumatoid arthritis with rheumatoid factor of multiple sites: Secondary | ICD-10-CM | POA: Diagnosis not present

## 2018-11-20 DIAGNOSIS — Z6824 Body mass index (BMI) 24.0-24.9, adult: Secondary | ICD-10-CM | POA: Diagnosis not present

## 2018-12-16 ENCOUNTER — Other Ambulatory Visit: Payer: Self-pay | Admitting: Internal Medicine

## 2018-12-16 MED FILL — FOLIC ACID 1 MG TABS: 1 | 90 days supply | Qty: 90 | Fill #0

## 2018-12-16 MED FILL — METHOTREXATE SODIUM 2.5 MG: 2.5 | 90 days supply | Qty: 52 | Fill #0

## 2018-12-16 NOTE — Telephone Encounter (Signed)
Bobby Clay. Unsure if I can send these yet. Let me know and I'll help out however possible!

## 2018-12-19 ENCOUNTER — Other Ambulatory Visit: Payer: Self-pay | Admitting: Pharmacist

## 2018-12-19 DIAGNOSIS — Z125 Encounter for screening for malignant neoplasm of prostate: Secondary | ICD-10-CM | POA: Diagnosis not present

## 2018-12-19 DIAGNOSIS — E7800 Pure hypercholesterolemia, unspecified: Secondary | ICD-10-CM | POA: Diagnosis not present

## 2018-12-19 DIAGNOSIS — I10 Essential (primary) hypertension: Secondary | ICD-10-CM | POA: Diagnosis not present

## 2018-12-19 DIAGNOSIS — Z0000 Encounter for general adult medical examination without abnormal findings: Secondary | ICD-10-CM | POA: Diagnosis not present

## 2018-12-19 MED ORDER — ETANERCEPT 50 MG/ML ~~LOC~~ SOAJ: SUBCUTANEOUS | 5 refills | Status: DC

## 2018-12-19 MED FILL — ENBREL SURECLICK 50 MG/ML S: 50 | 28 days supply | Qty: 4 | Fill #0 | Status: TO

## 2019-01-21 MED FILL — ENBREL SURECLICK 50 MG/ML S: 50 | 28 days supply | Qty: 4 | Fill #0

## 2019-01-22 MED FILL — LISINOPRIL-HCTZ 20-12.5 MG: 20-12.5 | 90 days supply | Qty: 90 | Fill #0

## 2019-02-13 DIAGNOSIS — M1990 Unspecified osteoarthritis, unspecified site: Secondary | ICD-10-CM | POA: Diagnosis not present

## 2019-02-13 DIAGNOSIS — Z1212 Encounter for screening for malignant neoplasm of rectum: Secondary | ICD-10-CM | POA: Diagnosis not present

## 2019-02-13 DIAGNOSIS — Z23 Encounter for immunization: Secondary | ICD-10-CM | POA: Diagnosis not present

## 2019-02-13 DIAGNOSIS — E7800 Pure hypercholesterolemia, unspecified: Secondary | ICD-10-CM | POA: Diagnosis not present

## 2019-02-13 DIAGNOSIS — Z0000 Encounter for general adult medical examination without abnormal findings: Secondary | ICD-10-CM | POA: Diagnosis not present

## 2019-02-13 DIAGNOSIS — Z79899 Other long term (current) drug therapy: Secondary | ICD-10-CM | POA: Diagnosis not present

## 2019-02-13 DIAGNOSIS — I10 Essential (primary) hypertension: Secondary | ICD-10-CM | POA: Diagnosis not present

## 2019-02-13 DIAGNOSIS — E039 Hypothyroidism, unspecified: Secondary | ICD-10-CM | POA: Diagnosis not present

## 2019-02-17 MED FILL — ATORVASTATIN 40 MG TABLET: 40 | 90 days supply | Qty: 45 | Fill #0

## 2019-02-17 MED FILL — ENBREL SURECLICK 50 MG/ML S: 50 | 28 days supply | Qty: 4 | Fill #1

## 2019-02-18 DIAGNOSIS — M0589 Other rheumatoid arthritis with rheumatoid factor of multiple sites: Secondary | ICD-10-CM | POA: Diagnosis not present

## 2019-02-26 ENCOUNTER — Telehealth (INDEPENDENT_AMBULATORY_CARE_PROVIDER_SITE_OTHER): Payer: BLUE CROSS/BLUE SHIELD | Admitting: Pharmacist

## 2019-02-26 DIAGNOSIS — Z79899 Other long term (current) drug therapy: Secondary | ICD-10-CM

## 2019-02-26 NOTE — Progress Notes (Signed)
S: Patient presents today for review of his specialty medication.  Patient is currently taking Enbrel for rheumatoid arthritis. Patient is managed by Dr. Amil Amen for this.   Adherence: denies any missed doses  Efficacy: reports that it is still working well. He is concerned about what he is going to do when he switches to Medicare.  Dosing: 50 mg subQ weekly and rotates sites. Patient still on methotrexate and folic acid.   Drug-drug interactions: none  Monitoring: CBC: monitored every 3 months by Dr. Amil Amen, denies any abnormal labs S/sx of infection: denies Injection site reactions: denies S/sx of malignancy: denies  Plans to switch to Medicare next year. Has discussed with his rheumatologist plans to continue to Enbrel and what to do if it is not covered. His physician has discussed Plaquenil and infusions but patient is not interested.   O:     Lab Results  Component Value Date   WBC 12.7 (H) 10/23/2010   HGB 15.0 10/23/2010   HCT 44.0 10/23/2010   MCV 95.1 10/23/2010   PLT  10/23/2010    PLATELET CLUMPS NOTED ON SMEAR, COUNT APPEARS ADEQUATE      Chemistry      Component Value Date/Time   NA 141 10/23/2010 1320   K 4.4 10/23/2010 1320   CL 107 10/23/2010 1320   CO2 30 06/27/2009 0410   BUN 24 (H) 10/23/2010 1320   CREATININE 1.6 (H) 10/23/2010 1320      Component Value Date/Time   CALCIUM 8.0 (L) 06/27/2009 0410   ALKPHOS 111 06/27/2009 0410   AST 88 (H) 06/27/2009 0410   ALT 111 (H) 06/27/2009 0410   BILITOT 1.0 06/27/2009 0410       A/P: 1. Medication review: patient is tolerating Enbrel well with no adverse effects and no recent flares. Reviewed Enbrel with him, including the increased risk of infections, decrease in blood cell counts, possible risk of malignancy (unsure if this is due to the drug or due to the disease state), and injection site reactions. No recommendations for changes. Encouraged patient to look at specialty drug coverage when  evaluating plans to get an idea of what Enbrel would cost him on different plans.    Christella Hartigan, PharmD, BCPS, BCACP, CPP Clinical Pharmacist Practitioner  [phone]

## 2019-03-11 DIAGNOSIS — Z1212 Encounter for screening for malignant neoplasm of rectum: Secondary | ICD-10-CM | POA: Diagnosis not present

## 2019-03-11 DIAGNOSIS — Z1211 Encounter for screening for malignant neoplasm of colon: Secondary | ICD-10-CM | POA: Diagnosis not present

## 2019-03-17 MED FILL — FOLIC ACID 1 MG TABS: 1 | 90 days supply | Qty: 90 | Fill #0

## 2019-03-17 MED FILL — METHOTREXATE SODIUM 2.5 MG: 2.5 | 90 days supply | Qty: 52 | Fill #0

## 2019-03-17 MED FILL — ENBREL SURECLICK 50 MG/ML S: 50 | 28 days supply | Qty: 4 | Fill #2

## 2019-04-21 MED FILL — ENBREL SURECLICK 50 MG/ML S: 50 | 28 days supply | Qty: 4 | Fill #3

## 2019-04-21 MED FILL — LISINOPRIL-HCTZ 20-12.5 MG: 20-12.5 | 90 days supply | Qty: 90 | Fill #0

## 2019-05-19 MED FILL — ATORVASTATIN 40 MG TABLET: 40 | 90 days supply | Qty: 45 | Fill #0

## 2019-05-19 MED FILL — ENBREL SURECLICK 50 MG/ML S: 50 | 28 days supply | Qty: 4 | Fill #4

## 2019-05-21 DIAGNOSIS — M150 Primary generalized (osteo)arthritis: Secondary | ICD-10-CM | POA: Diagnosis not present

## 2019-05-21 DIAGNOSIS — Z6824 Body mass index (BMI) 24.0-24.9, adult: Secondary | ICD-10-CM | POA: Diagnosis not present

## 2019-05-21 DIAGNOSIS — M0589 Other rheumatoid arthritis with rheumatoid factor of multiple sites: Secondary | ICD-10-CM | POA: Diagnosis not present

## 2019-06-09 MED FILL — FOLIC ACID 1 MG TABS: 1 | 90 days supply | Qty: 90 | Fill #0

## 2019-06-09 MED FILL — METHOTREXATE SODIUM 2.5 MG: 2.5 | 90 days supply | Qty: 52 | Fill #0

## 2019-06-11 ENCOUNTER — Other Ambulatory Visit: Payer: Self-pay | Admitting: Pharmacist

## 2019-06-11 MED ORDER — ENBREL SURECLICK 50 MG/ML ~~LOC~~ SOAJ: SUBCUTANEOUS | 5 refills | Status: DC

## 2019-06-11 MED FILL — ENBREL SURECLICK 50 MG/ML S: 50 | 28 days supply | Qty: 4 | Fill #0

## 2019-07-21 MED FILL — ENBREL SURECLICK 50 MG/ML S: 50 | 28 days supply | Qty: 4 | Fill #1

## 2019-07-21 MED FILL — LISINOPRIL-HCTZ 20-12.5 MG: 20-12.5 | 90 days supply | Qty: 90 | Fill #1

## 2019-08-13 DIAGNOSIS — M0589 Other rheumatoid arthritis with rheumatoid factor of multiple sites: Secondary | ICD-10-CM | POA: Diagnosis not present

## 2019-08-18 MED FILL — ATORVASTATIN 40 MG TABLET: 40 | 90 days supply | Qty: 45 | Fill #1

## 2019-09-02 MED FILL — ENBREL SURECLICK 50 MG/ML S: 50 | 28 days supply | Qty: 4 | Fill #0

## 2019-09-15 MED FILL — METHOTREXATE SODIUM 2.5 MG: 2.5 | 90 days supply | Qty: 52 | Fill #1

## 2019-09-15 MED FILL — FOLIC ACID 1 MG TABS: 1 | 90 days supply | Qty: 90 | Fill #1

## 2019-09-23 MED FILL — ENBREL SURECLICK 50 MG/ML S: 50 | 28 days supply | Qty: 4 | Fill #1

## 2019-10-21 MED FILL — ENBREL SURECLICK 50 MG/ML S: 50 | 28 days supply | Qty: 4 | Fill #2

## 2019-10-30 MED FILL — ATORVASTATIN 40 MG TABLET: 40 | 90 days supply | Qty: 45 | Fill #0

## 2019-11-04 MED FILL — LISINOPRIL-HCTZ 20-12.5 MG: 20-12.5 | 90 days supply | Qty: 90 | Fill #0

## 2019-11-17 MED FILL — ENBREL SURECLICK 50 MG/ML S: 50 | 28 days supply | Qty: 4 | Fill #3

## 2019-11-19 DIAGNOSIS — M150 Primary generalized (osteo)arthritis: Secondary | ICD-10-CM | POA: Diagnosis not present

## 2019-11-19 DIAGNOSIS — Z6824 Body mass index (BMI) 24.0-24.9, adult: Secondary | ICD-10-CM | POA: Diagnosis not present

## 2019-11-19 DIAGNOSIS — M0589 Other rheumatoid arthritis with rheumatoid factor of multiple sites: Secondary | ICD-10-CM | POA: Diagnosis not present

## 2019-11-26 MED FILL — FOLIC ACID 1 MG TABS: 1 | 90 days supply | Qty: 90 | Fill #0

## 2019-12-15 ENCOUNTER — Other Ambulatory Visit: Payer: Self-pay | Admitting: Pharmacist

## 2019-12-15 MED ORDER — ENBREL SURECLICK 50 MG/ML ~~LOC~~ SOAJ: SUBCUTANEOUS | 3 refills | Status: DC

## 2019-12-15 MED FILL — ENBREL SURECLICK 50 MG/ML S: 50 | 28 days supply | Qty: 4 | Fill #0

## 2020-01-21 MED FILL — METHOTREXATE 2.5 MG TAB: 2.5 | 90 days supply | Qty: 52 | Fill #0

## 2020-01-21 MED FILL — ENBREL SURECLICK 50 MG/ML S: 50 | 28 days supply | Qty: 4 | Fill #1

## 2020-02-09 MED FILL — ATORVASTATIN CALCIUM 40 MG: 40 | 90 days supply | Qty: 45 | Fill #1

## 2020-02-10 MED FILL — FOLIC ACID 1 MG TABS: 1 | 90 days supply | Qty: 90 | Fill #0

## 2020-02-16 MED FILL — ENBREL SURECLICK 50 MG/ML S: 50 | 28 days supply | Qty: 4 | Fill #2

## 2020-02-18 DIAGNOSIS — Z125 Encounter for screening for malignant neoplasm of prostate: Secondary | ICD-10-CM | POA: Diagnosis not present

## 2020-02-18 DIAGNOSIS — I10 Essential (primary) hypertension: Secondary | ICD-10-CM | POA: Diagnosis not present

## 2020-02-23 DIAGNOSIS — Z79899 Other long term (current) drug therapy: Secondary | ICD-10-CM | POA: Diagnosis not present

## 2020-02-23 DIAGNOSIS — N200 Calculus of kidney: Secondary | ICD-10-CM | POA: Diagnosis not present

## 2020-02-23 DIAGNOSIS — I10 Essential (primary) hypertension: Secondary | ICD-10-CM | POA: Diagnosis not present

## 2020-02-23 DIAGNOSIS — E7800 Pure hypercholesterolemia, unspecified: Secondary | ICD-10-CM | POA: Diagnosis not present

## 2020-02-23 DIAGNOSIS — M069 Rheumatoid arthritis, unspecified: Secondary | ICD-10-CM | POA: Diagnosis not present

## 2020-02-23 DIAGNOSIS — E042 Nontoxic multinodular goiter: Secondary | ICD-10-CM | POA: Diagnosis not present

## 2020-02-23 DIAGNOSIS — Z8249 Family history of ischemic heart disease and other diseases of the circulatory system: Secondary | ICD-10-CM | POA: Diagnosis not present

## 2020-02-23 DIAGNOSIS — Z0000 Encounter for general adult medical examination without abnormal findings: Secondary | ICD-10-CM | POA: Diagnosis not present

## 2020-02-23 DIAGNOSIS — Z1212 Encounter for screening for malignant neoplasm of rectum: Secondary | ICD-10-CM | POA: Diagnosis not present

## 2020-02-23 DIAGNOSIS — Z23 Encounter for immunization: Secondary | ICD-10-CM | POA: Diagnosis not present

## 2020-03-08 ENCOUNTER — Ambulatory Visit (HOSPITAL_BASED_OUTPATIENT_CLINIC_OR_DEPARTMENT_OTHER): Payer: BLUE CROSS/BLUE SHIELD | Admitting: Pharmacist

## 2020-03-08 ENCOUNTER — Other Ambulatory Visit: Payer: Self-pay

## 2020-03-08 DIAGNOSIS — Z79899 Other long term (current) drug therapy: Secondary | ICD-10-CM

## 2020-03-08 MED FILL — ENBREL SURECLICK 50 MG/ML S: 50 | 28 days supply | Qty: 4 | Fill #3

## 2020-03-08 NOTE — Progress Notes (Signed)
S: Patient presents today for review of his specialty medication.  Patient is currently taking Enbrel for rheumatoid arthritis. Patient is managed by Dr. Dierdre Forth for this.   Adherence: denies any missed doses  Efficacy: reports that it is still working well.   Dosing: 50 mg subQ weekly and rotates sites. Patient still on methotrexate and folic acid.   Drug-drug interactions: none  Monitoring: CBC: monitored every 3 months by Dr. Dierdre Forth, reports no abnormal labs S/sx of infection: denies Injection site reactions: denies S/sx of malignancy: denies  O:  Lab Results  Component Value Date   WBC 12.7 (H) 10/23/2010   HGB 15.0 10/23/2010   HCT 44.0 10/23/2010   MCV 95.1 10/23/2010   PLT  10/23/2010    PLATELET CLUMPS NOTED ON SMEAR, COUNT APPEARS ADEQUATE      Chemistry      Component Value Date/Time   NA 141 10/23/2010 1320   K 4.4 10/23/2010 1320   CL 107 10/23/2010 1320   CO2 30 06/27/2009 0410   BUN 24 (H) 10/23/2010 1320   CREATININE 1.6 (H) 10/23/2010 1320      Component Value Date/Time   CALCIUM 8.0 (L) 06/27/2009 0410   ALKPHOS 111 06/27/2009 0410   AST 88 (H) 06/27/2009 0410   ALT 111 (H) 06/27/2009 0410   BILITOT 1.0 06/27/2009 0410       A/P: 1. Medication review: patient is tolerating Enbrel well with no adverse effects and no recent flares. Reviewed Enbrel with him, including the increased risk of infections, decrease in blood cell counts, possible risk of malignancy (unsure if this is due to the drug or due to the disease state), and injection site reactions. No recommendations for changes.   Butch Penny, PharmD, CPP Clinical Pharmacist [facility] [phone]

## 2020-03-24 DIAGNOSIS — Z23 Encounter for immunization: Secondary | ICD-10-CM | POA: Diagnosis not present

## 2020-03-30 DIAGNOSIS — N390 Urinary tract infection, site not specified: Secondary | ICD-10-CM | POA: Diagnosis not present

## 2020-03-30 DIAGNOSIS — N410 Acute prostatitis: Secondary | ICD-10-CM | POA: Diagnosis not present

## 2020-03-30 MED FILL — CIPROFLOXACIN HCL 500 MG TA: 500 | 28 days supply | Qty: 28 | Fill #0

## 2020-03-31 ENCOUNTER — Other Ambulatory Visit: Payer: Self-pay | Admitting: Pharmacist

## 2020-03-31 MED ORDER — ENBREL SURECLICK 50 MG/ML ~~LOC~~ SOAJ: SUBCUTANEOUS | 3 refills | Status: DC

## 2020-04-12 MED FILL — ENBREL SURECLICK 50 MG/ML S: 50 | 28 days supply | Qty: 4 | Fill #0

## 2020-05-04 ENCOUNTER — Other Ambulatory Visit (HOSPITAL_COMMUNITY): Payer: Self-pay | Admitting: Internal Medicine

## 2020-05-04 MED FILL — CIPROFLOXACIN HCL 500 MG TA: 500 | 28 days supply | Qty: 28 | Fill #1

## 2020-05-04 MED FILL — ATORVASTATIN CALCIUM 40 MG: 40 | 90 days supply | Qty: 45 | Fill #2

## 2020-05-04 MED FILL — LISINOPRIL-HCTZ 20-12.5 MG: 20-12.5 | 90 days supply | Qty: 90 | Fill #0

## 2020-05-04 MED FILL — FOLIC ACID 1 MG TABS: 1 | 90 days supply | Qty: 90 | Fill #1

## 2020-05-04 MED FILL — METHOTREXATE SODIUM 2.5 MG: 2.5 | 90 days supply | Qty: 52 | Fill #1

## 2020-05-12 MED FILL — ENBREL SURECLICK 50 MG/ML S: 50 | 28 days supply | Qty: 4 | Fill #1

## 2020-06-09 MED FILL — ENBREL SURECLICK 50 MG/ML S: 50 | 28 days supply | Qty: 4 | Fill #2

## 2020-07-06 DIAGNOSIS — Z6824 Body mass index (BMI) 24.0-24.9, adult: Secondary | ICD-10-CM | POA: Diagnosis not present

## 2020-07-06 DIAGNOSIS — M0589 Other rheumatoid arthritis with rheumatoid factor of multiple sites: Secondary | ICD-10-CM | POA: Diagnosis not present

## 2020-07-06 DIAGNOSIS — M150 Primary generalized (osteo)arthritis: Secondary | ICD-10-CM | POA: Diagnosis not present

## 2020-07-08 MED FILL — ENBREL SURECLICK 50 MG/ML S: 50 | 28 days supply | Qty: 4 | Fill #3

## 2020-07-25 DIAGNOSIS — Z20822 Contact with and (suspected) exposure to covid-19: Secondary | ICD-10-CM | POA: Diagnosis not present

## 2020-08-03 ENCOUNTER — Other Ambulatory Visit: Payer: Self-pay | Admitting: Pharmacist

## 2020-08-03 MED ORDER — ENBREL SURECLICK 50 MG/ML ~~LOC~~ SOAJ: SUBCUTANEOUS | 3 refills | Status: DC

## 2020-08-03 MED FILL — ATORVASTATIN 40 MG TABLET: 40 | 90 days supply | Qty: 45 | Fill #0

## 2020-08-03 MED FILL — LISINOPRIL-HCTZ 20-12.5 MG: 20-12.5 | 90 days supply | Qty: 90 | Fill #1

## 2020-08-04 MED FILL — FOLIC ACID 1 MG TABS: 1 | 90 days supply | Qty: 90 | Fill #0

## 2020-08-04 MED FILL — METHOTREXATE SODIUM 2.5 MG: 2.5 | 90 days supply | Qty: 52 | Fill #0

## 2020-08-09 MED FILL — ENBREL SURECLICK 50 MG/ML S: 50 | 28 days supply | Qty: 4 | Fill #0

## 2020-09-09 MED FILL — ENBREL SURECLICK 50 MG/ML S: 50 | 28 days supply | Qty: 4 | Fill #1

## 2020-09-29 DIAGNOSIS — M0589 Other rheumatoid arthritis with rheumatoid factor of multiple sites: Secondary | ICD-10-CM | POA: Diagnosis not present

## 2020-10-07 MED FILL — ENBREL SURECLICK 50 MG/ML S: 50 | 28 days supply | Qty: 4 | Fill #2

## 2020-11-01 MED FILL — METHOTREXATE SODIUM 2.5 MG: 2.5 | 90 days supply | Qty: 52 | Fill #1

## 2020-11-01 MED FILL — ENBREL SURECLICK 50 MG/ML S: 50 | 28 days supply | Qty: 4 | Fill #3

## 2020-11-01 MED FILL — ATORVASTATIN 40 MG TABLET: 40 | 90 days supply | Qty: 45 | Fill #1

## 2020-11-01 MED FILL — FOLIC ACID 1 MG TABS: 1 | 90 days supply | Qty: 90 | Fill #1

## 2020-11-01 MED FILL — LISINOPRIL-HCTZ 20-12.5 MG: 20-12.5 | 90 days supply | Qty: 90 | Fill #2

## 2020-11-24 ENCOUNTER — Other Ambulatory Visit: Payer: Self-pay | Admitting: Pharmacist

## 2020-11-24 MED ORDER — ENBREL SURECLICK 50 MG/ML ~~LOC~~ SOAJ
SUBCUTANEOUS | 3 refills | Status: DC
  Filled 2020-12-13: qty 4, 28d supply, fill #0
  Filled 2021-01-05: qty 4, 28d supply, fill #1
  Filled 2021-02-07: qty 4, 28d supply, fill #2
  Filled 2021-03-07: qty 4, 28d supply, fill #3

## 2020-11-25 ENCOUNTER — Other Ambulatory Visit (HOSPITAL_COMMUNITY): Payer: Self-pay

## 2020-12-13 ENCOUNTER — Other Ambulatory Visit (HOSPITAL_COMMUNITY): Payer: Self-pay

## 2020-12-15 ENCOUNTER — Other Ambulatory Visit (HOSPITAL_COMMUNITY): Payer: Self-pay

## 2020-12-28 ENCOUNTER — Other Ambulatory Visit (HOSPITAL_COMMUNITY): Payer: Self-pay

## 2020-12-28 DIAGNOSIS — M150 Primary generalized (osteo)arthritis: Secondary | ICD-10-CM | POA: Diagnosis not present

## 2020-12-28 DIAGNOSIS — M0589 Other rheumatoid arthritis with rheumatoid factor of multiple sites: Secondary | ICD-10-CM | POA: Diagnosis not present

## 2020-12-28 DIAGNOSIS — Z6823 Body mass index (BMI) 23.0-23.9, adult: Secondary | ICD-10-CM | POA: Diagnosis not present

## 2021-01-05 ENCOUNTER — Other Ambulatory Visit (HOSPITAL_COMMUNITY): Payer: Self-pay

## 2021-01-06 ENCOUNTER — Other Ambulatory Visit (HOSPITAL_COMMUNITY): Payer: Self-pay

## 2021-02-02 ENCOUNTER — Other Ambulatory Visit (HOSPITAL_COMMUNITY): Payer: Self-pay

## 2021-02-05 ENCOUNTER — Other Ambulatory Visit (HOSPITAL_COMMUNITY): Payer: Self-pay

## 2021-02-05 ENCOUNTER — Other Ambulatory Visit: Payer: Self-pay

## 2021-02-05 MED FILL — Atorvastatin Calcium Tab 40 MG (Base Equivalent): ORAL | 90 days supply | Qty: 45 | Fill #0 | Status: AC

## 2021-02-07 ENCOUNTER — Other Ambulatory Visit (HOSPITAL_COMMUNITY): Payer: Self-pay

## 2021-02-07 MED ORDER — METHOTREXATE SODIUM 2.5 MG PO TABS
ORAL_TABLET | ORAL | 0 refills | Status: DC
  Filled 2021-02-07: qty 52, 90d supply, fill #0

## 2021-02-07 MED ORDER — LISINOPRIL-HYDROCHLOROTHIAZIDE 20-12.5 MG PO TABS
ORAL_TABLET | ORAL | 3 refills | Status: DC
  Filled 2021-02-07: qty 90, 90d supply, fill #0
  Filled 2021-04-28: qty 90, 90d supply, fill #1
  Filled 2021-08-01: qty 90, 90d supply, fill #2
  Filled 2021-11-01: qty 90, 90d supply, fill #3

## 2021-02-07 MED ORDER — FOLIC ACID 1 MG PO TABS
ORAL_TABLET | ORAL | 3 refills | Status: DC
  Filled 2021-02-07: qty 90, 90d supply, fill #0
  Filled 2021-04-28: qty 90, 90d supply, fill #1
  Filled 2021-08-01: qty 90, 90d supply, fill #2
  Filled 2021-11-01: qty 90, 90d supply, fill #3

## 2021-02-14 ENCOUNTER — Other Ambulatory Visit (HOSPITAL_COMMUNITY): Payer: Self-pay

## 2021-03-07 ENCOUNTER — Other Ambulatory Visit (HOSPITAL_COMMUNITY): Payer: Self-pay

## 2021-03-10 ENCOUNTER — Other Ambulatory Visit: Payer: Self-pay

## 2021-03-10 ENCOUNTER — Other Ambulatory Visit (HOSPITAL_COMMUNITY): Payer: Self-pay

## 2021-03-10 ENCOUNTER — Ambulatory Visit: Payer: BLUE CROSS/BLUE SHIELD | Attending: Family Medicine | Admitting: Pharmacist

## 2021-03-10 DIAGNOSIS — Z79899 Other long term (current) drug therapy: Secondary | ICD-10-CM

## 2021-03-10 NOTE — Progress Notes (Signed)
S: Patient presents today for review of his specialty medication.  Patient is currently taking Enbrel for rheumatoid arthritis. Patient is managed by Dr. Beekman for this.   Adherence: denies any missed doses  Efficacy: reports that it is still working well.   Dosing: 50 mg subQ weekly and rotates sites. Patient still on methotrexate and folic acid.   Drug-drug interactions: none  Monitoring: CBC: monitored every 3 months by Dr. Beekman, reports no abnormal labs S/sx of infection: denies Injection site reactions: denies S/sx of malignancy: denies  O:  Lab Results  Component Value Date   WBC 12.7 (H) 10/23/2010   HGB 15.0 10/23/2010   HCT 44.0 10/23/2010   MCV 95.1 10/23/2010   PLT  10/23/2010    PLATELET CLUMPS NOTED ON SMEAR, COUNT APPEARS ADEQUATE      Chemistry      Component Value Date/Time   NA 141 10/23/2010 1320   K 4.4 10/23/2010 1320   CL 107 10/23/2010 1320   CO2 30 06/27/2009 0410   BUN 24 (H) 10/23/2010 1320   CREATININE 1.6 (H) 10/23/2010 1320      Component Value Date/Time   CALCIUM 8.0 (L) 06/27/2009 0410   ALKPHOS 111 06/27/2009 0410   AST 88 (H) 06/27/2009 0410   ALT 111 (H) 06/27/2009 0410   BILITOT 1.0 06/27/2009 0410       A/P: 1. Medication review: patient is tolerating Enbrel well with no adverse effects and no recent flares. Reviewed Enbrel with him, including the increased risk of infections, decrease in blood cell counts, possible risk of malignancy (unsure if this is due to the drug or due to the disease state), and injection site reactions. No recommendations for changes.   Luke Van Ausdall, PharmD, BCACP, CPP Clinical Pharmacist [facility] [phone]    

## 2021-03-29 DIAGNOSIS — M0589 Other rheumatoid arthritis with rheumatoid factor of multiple sites: Secondary | ICD-10-CM | POA: Diagnosis not present

## 2021-04-06 ENCOUNTER — Other Ambulatory Visit (HOSPITAL_COMMUNITY): Payer: Self-pay

## 2021-04-08 ENCOUNTER — Other Ambulatory Visit (HOSPITAL_COMMUNITY): Payer: Self-pay

## 2021-04-11 ENCOUNTER — Other Ambulatory Visit (HOSPITAL_COMMUNITY): Payer: Self-pay

## 2021-04-11 MED ORDER — ENBREL SURECLICK 50 MG/ML ~~LOC~~ SOAJ
SUBCUTANEOUS | 3 refills | Status: DC
  Filled 2021-04-11: qty 4, 28d supply, fill #0
  Filled 2021-05-03 (×2): qty 4, 28d supply, fill #1

## 2021-04-28 ENCOUNTER — Other Ambulatory Visit (HOSPITAL_COMMUNITY): Payer: Self-pay

## 2021-04-28 MED FILL — Atorvastatin Calcium Tab 40 MG (Base Equivalent): ORAL | 90 days supply | Qty: 45 | Fill #1 | Status: AC

## 2021-04-29 ENCOUNTER — Other Ambulatory Visit (HOSPITAL_COMMUNITY): Payer: Self-pay

## 2021-04-29 MED ORDER — METHOTREXATE SODIUM 2.5 MG PO TABS
ORAL_TABLET | ORAL | 0 refills | Status: DC
  Filled 2021-04-29: qty 52, 90d supply, fill #0

## 2021-05-03 ENCOUNTER — Other Ambulatory Visit (HOSPITAL_COMMUNITY): Payer: Self-pay

## 2021-05-09 ENCOUNTER — Other Ambulatory Visit (HOSPITAL_COMMUNITY): Payer: Self-pay

## 2021-05-10 ENCOUNTER — Other Ambulatory Visit (HOSPITAL_COMMUNITY): Payer: Self-pay

## 2021-05-16 ENCOUNTER — Other Ambulatory Visit (HOSPITAL_COMMUNITY): Payer: Self-pay

## 2021-05-17 ENCOUNTER — Other Ambulatory Visit (HOSPITAL_COMMUNITY): Payer: Self-pay

## 2021-05-17 ENCOUNTER — Other Ambulatory Visit: Payer: Self-pay | Admitting: Pharmacist

## 2021-05-17 MED ORDER — ENBREL SURECLICK 50 MG/ML ~~LOC~~ SOAJ
SUBCUTANEOUS | 3 refills | Status: DC
  Filled 2021-05-17: qty 4, fill #0
  Filled 2021-06-02: qty 4, 28d supply, fill #0
  Filled 2021-07-07: qty 4, 28d supply, fill #1
  Filled 2021-08-01 – 2021-08-04 (×3): qty 4, 28d supply, fill #2
  Filled 2021-08-24: qty 4, 28d supply, fill #3

## 2021-05-24 DIAGNOSIS — Z0000 Encounter for general adult medical examination without abnormal findings: Secondary | ICD-10-CM | POA: Diagnosis not present

## 2021-05-31 ENCOUNTER — Other Ambulatory Visit (HOSPITAL_COMMUNITY): Payer: Self-pay

## 2021-05-31 ENCOUNTER — Other Ambulatory Visit: Payer: Self-pay | Admitting: Internal Medicine

## 2021-05-31 DIAGNOSIS — R972 Elevated prostate specific antigen [PSA]: Secondary | ICD-10-CM | POA: Diagnosis not present

## 2021-05-31 DIAGNOSIS — E7800 Pure hypercholesterolemia, unspecified: Secondary | ICD-10-CM | POA: Diagnosis not present

## 2021-05-31 DIAGNOSIS — M069 Rheumatoid arthritis, unspecified: Secondary | ICD-10-CM | POA: Diagnosis not present

## 2021-05-31 DIAGNOSIS — Z0000 Encounter for general adult medical examination without abnormal findings: Secondary | ICD-10-CM | POA: Diagnosis not present

## 2021-05-31 DIAGNOSIS — E039 Hypothyroidism, unspecified: Secondary | ICD-10-CM | POA: Diagnosis not present

## 2021-05-31 DIAGNOSIS — I10 Essential (primary) hypertension: Secondary | ICD-10-CM | POA: Diagnosis not present

## 2021-05-31 DIAGNOSIS — Z23 Encounter for immunization: Secondary | ICD-10-CM | POA: Diagnosis not present

## 2021-05-31 DIAGNOSIS — Z79899 Other long term (current) drug therapy: Secondary | ICD-10-CM | POA: Diagnosis not present

## 2021-05-31 MED ORDER — CIPROFLOXACIN HCL 500 MG PO TABS
ORAL_TABLET | ORAL | 0 refills | Status: AC
  Filled 2021-05-31: qty 42, 21d supply, fill #0

## 2021-06-01 ENCOUNTER — Other Ambulatory Visit (HOSPITAL_COMMUNITY): Payer: Self-pay

## 2021-06-02 ENCOUNTER — Other Ambulatory Visit (HOSPITAL_COMMUNITY): Payer: Self-pay

## 2021-06-07 ENCOUNTER — Other Ambulatory Visit (HOSPITAL_COMMUNITY): Payer: Self-pay

## 2021-06-28 DIAGNOSIS — R972 Elevated prostate specific antigen [PSA]: Secondary | ICD-10-CM | POA: Diagnosis not present

## 2021-06-28 DIAGNOSIS — E039 Hypothyroidism, unspecified: Secondary | ICD-10-CM | POA: Diagnosis not present

## 2021-06-28 DIAGNOSIS — Z125 Encounter for screening for malignant neoplasm of prostate: Secondary | ICD-10-CM | POA: Diagnosis not present

## 2021-07-01 ENCOUNTER — Ambulatory Visit
Admission: RE | Admit: 2021-07-01 | Discharge: 2021-07-01 | Disposition: A | Discharge status: Home / Self Care | Payer: No Typology Code available for payment source | Source: Ambulatory Visit | Attending: Internal Medicine | Admitting: Internal Medicine

## 2021-07-01 DIAGNOSIS — I10 Essential (primary) hypertension: Secondary | ICD-10-CM

## 2021-07-05 DIAGNOSIS — M0589 Other rheumatoid arthritis with rheumatoid factor of multiple sites: Secondary | ICD-10-CM | POA: Diagnosis not present

## 2021-07-05 DIAGNOSIS — M150 Primary generalized (osteo)arthritis: Secondary | ICD-10-CM | POA: Diagnosis not present

## 2021-07-05 DIAGNOSIS — Z6824 Body mass index (BMI) 24.0-24.9, adult: Secondary | ICD-10-CM | POA: Diagnosis not present

## 2021-07-07 ENCOUNTER — Other Ambulatory Visit (HOSPITAL_COMMUNITY): Payer: Self-pay

## 2021-07-12 DIAGNOSIS — I2510 Atherosclerotic heart disease of native coronary artery without angina pectoris: Secondary | ICD-10-CM | POA: Diagnosis not present

## 2021-07-12 DIAGNOSIS — I2584 Coronary atherosclerosis due to calcified coronary lesion: Secondary | ICD-10-CM | POA: Diagnosis not present

## 2021-08-01 ENCOUNTER — Other Ambulatory Visit (HOSPITAL_COMMUNITY): Payer: Self-pay

## 2021-08-01 MED ORDER — METHOTREXATE SODIUM 2.5 MG PO TABS
ORAL_TABLET | ORAL | 1 refills | Status: DC
  Filled 2021-08-01: qty 52, 90d supply, fill #0
  Filled 2021-11-01: qty 52, 90d supply, fill #1

## 2021-08-01 MED FILL — Atorvastatin Calcium Tab 40 MG (Base Equivalent): ORAL | 90 days supply | Qty: 45 | Fill #2 | Status: CN

## 2021-08-02 ENCOUNTER — Other Ambulatory Visit (HOSPITAL_COMMUNITY): Payer: Self-pay

## 2021-08-04 ENCOUNTER — Other Ambulatory Visit (HOSPITAL_COMMUNITY): Payer: Self-pay

## 2021-08-04 ENCOUNTER — Other Ambulatory Visit: Payer: Self-pay

## 2021-08-04 MED ORDER — ATORVASTATIN CALCIUM 40 MG PO TABS
20.0000 mg | ORAL_TABLET | Freq: Every day | ORAL | 2 refills | Status: DC
  Filled 2021-08-04: qty 45, 90d supply, fill #0
  Filled 2021-11-01: qty 45, 90d supply, fill #1
  Filled 2022-01-31: qty 45, 90d supply, fill #2

## 2021-08-05 ENCOUNTER — Other Ambulatory Visit (HOSPITAL_COMMUNITY): Payer: Self-pay

## 2021-08-08 ENCOUNTER — Other Ambulatory Visit (HOSPITAL_COMMUNITY): Payer: Self-pay

## 2021-08-16 ENCOUNTER — Other Ambulatory Visit (HOSPITAL_COMMUNITY): Payer: Self-pay

## 2021-08-17 ENCOUNTER — Other Ambulatory Visit (HOSPITAL_COMMUNITY): Payer: Self-pay

## 2021-08-18 ENCOUNTER — Other Ambulatory Visit (HOSPITAL_COMMUNITY): Payer: Self-pay

## 2021-08-24 ENCOUNTER — Other Ambulatory Visit (HOSPITAL_COMMUNITY): Payer: Self-pay

## 2021-08-25 ENCOUNTER — Other Ambulatory Visit (HOSPITAL_COMMUNITY): Payer: Self-pay

## 2021-09-06 ENCOUNTER — Other Ambulatory Visit (HOSPITAL_COMMUNITY): Payer: Self-pay

## 2021-09-06 MED ORDER — CARESTART COVID-19 HOME TEST VI KIT
PACK | 0 refills | Status: AC
  Filled 2021-09-06: qty 4, 4d supply, fill #0

## 2021-09-08 ENCOUNTER — Other Ambulatory Visit (HOSPITAL_COMMUNITY): Payer: Self-pay

## 2021-09-16 ENCOUNTER — Other Ambulatory Visit (HOSPITAL_COMMUNITY): Payer: Self-pay

## 2021-09-19 ENCOUNTER — Other Ambulatory Visit: Payer: Self-pay | Admitting: Pharmacist

## 2021-09-19 ENCOUNTER — Other Ambulatory Visit (HOSPITAL_COMMUNITY): Payer: Self-pay

## 2021-09-19 MED ORDER — ENBREL SURECLICK 50 MG/ML ~~LOC~~ SOAJ
SUBCUTANEOUS | 3 refills | Status: DC
  Filled 2021-09-19: qty 4, 28d supply, fill #0
  Filled 2021-10-17: qty 4, 28d supply, fill #1
  Filled 2021-11-14: qty 4, 28d supply, fill #2
  Filled 2021-12-09: qty 4, 28d supply, fill #3

## 2021-09-19 MED ORDER — ENBREL SURECLICK 50 MG/ML ~~LOC~~ SOAJ: SUBCUTANEOUS | 3 refills | Status: DC

## 2021-09-20 ENCOUNTER — Other Ambulatory Visit (HOSPITAL_COMMUNITY): Payer: Self-pay

## 2021-10-13 ENCOUNTER — Other Ambulatory Visit (HOSPITAL_COMMUNITY): Payer: Self-pay

## 2021-10-17 ENCOUNTER — Other Ambulatory Visit (HOSPITAL_COMMUNITY): Payer: Self-pay

## 2021-10-19 ENCOUNTER — Other Ambulatory Visit (HOSPITAL_COMMUNITY): Payer: Self-pay

## 2021-11-01 ENCOUNTER — Other Ambulatory Visit (HOSPITAL_COMMUNITY): Payer: Self-pay

## 2021-11-01 DIAGNOSIS — M0589 Other rheumatoid arthritis with rheumatoid factor of multiple sites: Secondary | ICD-10-CM | POA: Diagnosis not present

## 2021-11-14 ENCOUNTER — Other Ambulatory Visit (HOSPITAL_COMMUNITY): Payer: Self-pay

## 2021-11-17 ENCOUNTER — Other Ambulatory Visit (HOSPITAL_COMMUNITY): Payer: Self-pay

## 2021-12-08 ENCOUNTER — Other Ambulatory Visit (HOSPITAL_COMMUNITY): Payer: Self-pay

## 2021-12-09 ENCOUNTER — Other Ambulatory Visit (HOSPITAL_COMMUNITY): Payer: Self-pay

## 2022-01-09 ENCOUNTER — Other Ambulatory Visit (HOSPITAL_COMMUNITY): Payer: Self-pay

## 2022-01-09 ENCOUNTER — Other Ambulatory Visit: Payer: Self-pay | Admitting: Internal Medicine

## 2022-01-09 NOTE — Telephone Encounter (Signed)
Will forward to [facility]. Looks like a speciality patient ?

## 2022-01-10 ENCOUNTER — Other Ambulatory Visit: Payer: Self-pay | Admitting: Pharmacist

## 2022-01-10 ENCOUNTER — Other Ambulatory Visit (HOSPITAL_COMMUNITY): Payer: Self-pay

## 2022-01-10 DIAGNOSIS — Z6824 Body mass index (BMI) 24.0-24.9, adult: Secondary | ICD-10-CM | POA: Diagnosis not present

## 2022-01-10 DIAGNOSIS — M0589 Other rheumatoid arthritis with rheumatoid factor of multiple sites: Secondary | ICD-10-CM | POA: Diagnosis not present

## 2022-01-10 DIAGNOSIS — M1991 Primary osteoarthritis, unspecified site: Secondary | ICD-10-CM | POA: Diagnosis not present

## 2022-01-10 MED ORDER — ENBREL SURECLICK 50 MG/ML ~~LOC~~ SOAJ
SUBCUTANEOUS | 5 refills | Status: DC
  Filled 2022-01-10: qty 4, 28d supply, fill #0
  Filled 2022-02-02: qty 4, 28d supply, fill #1
  Filled 2022-03-02: qty 4, 28d supply, fill #2
  Filled 2022-03-31: qty 4, 28d supply, fill #3
  Filled 2022-05-11: qty 4, 28d supply, fill #4
  Filled 2022-06-02: qty 4, 28d supply, fill #5

## 2022-01-10 MED ORDER — ENBREL SURECLICK 50 MG/ML ~~LOC~~ SOAJ: SUBCUTANEOUS | 5 refills | Status: DC

## 2022-01-12 ENCOUNTER — Other Ambulatory Visit (HOSPITAL_COMMUNITY): Payer: Self-pay

## 2022-01-31 ENCOUNTER — Other Ambulatory Visit (HOSPITAL_COMMUNITY): Payer: Self-pay

## 2022-01-31 MED ORDER — FOLIC ACID 1 MG PO TABS
1.0000 mg | ORAL_TABLET | Freq: Every day | ORAL | 3 refills | Status: DC
Start: 2022-01-31 — End: 2023-01-24
  Filled 2022-01-31: qty 90, 90d supply, fill #0
  Filled 2022-04-24: qty 90, 90d supply, fill #1
  Filled 2022-07-20: qty 90, 90d supply, fill #2
  Filled 2022-10-24: qty 90, 90d supply, fill #3

## 2022-01-31 MED ORDER — METHOTREXATE SODIUM 2.5 MG PO TABS
ORAL_TABLET | ORAL | 1 refills | Status: DC
  Filled 2022-01-31: qty 52, 90d supply, fill #0
  Filled 2022-01-31: qty 33, 71d supply, fill #0
  Filled 2022-01-31: qty 19, 20d supply, fill #0
  Filled 2022-04-24: qty 52, 90d supply, fill #1

## 2022-01-31 MED ORDER — LISINOPRIL-HYDROCHLOROTHIAZIDE 20-12.5 MG PO TABS
1.0000 | ORAL_TABLET | Freq: Every morning | ORAL | 3 refills | Status: DC
  Filled 2022-01-31: qty 90, 90d supply, fill #0
  Filled 2022-04-24: qty 90, 90d supply, fill #1
  Filled 2022-07-20: qty 90, 90d supply, fill #2
  Filled 2022-10-24: qty 90, 90d supply, fill #3

## 2022-02-02 ENCOUNTER — Other Ambulatory Visit (HOSPITAL_COMMUNITY): Payer: Self-pay

## 2022-02-03 ENCOUNTER — Other Ambulatory Visit (HOSPITAL_COMMUNITY): Payer: Self-pay

## 2022-02-07 ENCOUNTER — Other Ambulatory Visit (HOSPITAL_COMMUNITY): Payer: Self-pay

## 2022-03-02 ENCOUNTER — Other Ambulatory Visit (HOSPITAL_COMMUNITY): Payer: Self-pay

## 2022-03-08 ENCOUNTER — Telehealth: Payer: Self-pay | Admitting: Pharmacist

## 2022-03-08 NOTE — Telephone Encounter (Signed)
Called patient to schedule an appointment for the [facility]. I was unable to reach the patient so I left a HIPAA-compliant message requesting that the patient return my call.   Luke Van Ausdall, PharmD, BCACP, CPP Clinical Pharmacist [facility] [phone]  

## 2022-03-09 ENCOUNTER — Other Ambulatory Visit (HOSPITAL_COMMUNITY): Payer: Self-pay

## 2022-03-10 ENCOUNTER — Ambulatory Visit: Payer: BLUE CROSS/BLUE SHIELD | Attending: Internal Medicine | Admitting: Pharmacist

## 2022-03-10 DIAGNOSIS — Z79899 Other long term (current) drug therapy: Secondary | ICD-10-CM

## 2022-03-10 NOTE — Progress Notes (Signed)
S: Patient presents today for review of his specialty medication.  Patient is currently taking Enbrel for rheumatoid arthritis. Patient is managed by Dr. Dierdre Forth for this.   Adherence: denies any missed doses  Efficacy: reports that it is still working well.   Dosing: 50 mg subQ weekly and rotates sites. Patient still on methotrexate and folic acid.   Drug-drug interactions: none  Monitoring: CBC: monitored every 3 months by Dr. Dierdre Forth, reports no abnormal labs S/sx of infection: denies Injection site reactions: denies S/sx of malignancy: denies  O:  Lab Results  Component Value Date   WBC 12.7 (H) 10/23/2010   HGB 15.0 10/23/2010   HCT 44.0 10/23/2010   MCV 95.1 10/23/2010   PLT  10/23/2010    PLATELET CLUMPS NOTED ON SMEAR, COUNT APPEARS ADEQUATE      Chemistry      Component Value Date/Time   NA 141 10/23/2010 1320   K 4.4 10/23/2010 1320   CL 107 10/23/2010 1320   CO2 30 06/27/2009 0410   BUN 24 (H) 10/23/2010 1320   CREATININE 1.6 (H) 10/23/2010 1320      Component Value Date/Time   CALCIUM 8.0 (L) 06/27/2009 0410   ALKPHOS 111 06/27/2009 0410   AST 88 (H) 06/27/2009 0410   ALT 111 (H) 06/27/2009 0410   BILITOT 1.0 06/27/2009 0410       A/P: 1. Medication review: patient is tolerating Enbrel well with no adverse effects and no recent flares. Reviewed Enbrel with him, including the increased risk of infections, decrease in blood cell counts, possible risk of malignancy (unsure if this is due to the drug or due to the disease state), and injection site reactions. No recommendations for changes.   Butch Penny, PharmD, [place]y [place], CPP Clinical Pharmacist [facility] [phone]

## 2022-03-31 ENCOUNTER — Other Ambulatory Visit (HOSPITAL_COMMUNITY): Payer: Self-pay

## 2022-04-06 ENCOUNTER — Other Ambulatory Visit (HOSPITAL_COMMUNITY): Payer: Self-pay

## 2022-04-11 DIAGNOSIS — M0589 Other rheumatoid arthritis with rheumatoid factor of multiple sites: Secondary | ICD-10-CM | POA: Diagnosis not present

## 2022-04-24 ENCOUNTER — Other Ambulatory Visit (HOSPITAL_COMMUNITY): Payer: Self-pay

## 2022-04-24 MED ORDER — ATORVASTATIN CALCIUM 40 MG PO TABS
20.0000 mg | ORAL_TABLET | Freq: Every day | ORAL | 2 refills | Status: DC
  Filled 2022-04-24: qty 45, 90d supply, fill #0
  Filled 2022-07-20: qty 45, 90d supply, fill #1
  Filled 2022-10-24: qty 45, 90d supply, fill #2

## 2022-05-02 ENCOUNTER — Other Ambulatory Visit (HOSPITAL_COMMUNITY): Payer: Self-pay

## 2022-05-11 ENCOUNTER — Other Ambulatory Visit (HOSPITAL_COMMUNITY): Payer: Self-pay

## 2022-05-30 DIAGNOSIS — Z125 Encounter for screening for malignant neoplasm of prostate: Secondary | ICD-10-CM | POA: Diagnosis not present

## 2022-05-30 DIAGNOSIS — Z0000 Encounter for general adult medical examination without abnormal findings: Secondary | ICD-10-CM | POA: Diagnosis not present

## 2022-06-01 ENCOUNTER — Other Ambulatory Visit (HOSPITAL_COMMUNITY): Payer: Self-pay

## 2022-06-02 ENCOUNTER — Other Ambulatory Visit (HOSPITAL_COMMUNITY): Payer: Self-pay

## 2022-06-02 DIAGNOSIS — Z0000 Encounter for general adult medical examination without abnormal findings: Secondary | ICD-10-CM | POA: Diagnosis not present

## 2022-06-02 DIAGNOSIS — E042 Nontoxic multinodular goiter: Secondary | ICD-10-CM | POA: Diagnosis not present

## 2022-06-02 DIAGNOSIS — R972 Elevated prostate specific antigen [PSA]: Secondary | ICD-10-CM | POA: Diagnosis not present

## 2022-06-02 DIAGNOSIS — K4020 Bilateral inguinal hernia, without obstruction or gangrene, not specified as recurrent: Secondary | ICD-10-CM | POA: Diagnosis not present

## 2022-06-02 DIAGNOSIS — Z23 Encounter for immunization: Secondary | ICD-10-CM | POA: Diagnosis not present

## 2022-06-02 DIAGNOSIS — I2510 Atherosclerotic heart disease of native coronary artery without angina pectoris: Secondary | ICD-10-CM | POA: Diagnosis not present

## 2022-06-02 DIAGNOSIS — Z1212 Encounter for screening for malignant neoplasm of rectum: Secondary | ICD-10-CM | POA: Diagnosis not present

## 2022-06-02 DIAGNOSIS — Z79899 Other long term (current) drug therapy: Secondary | ICD-10-CM | POA: Diagnosis not present

## 2022-06-02 DIAGNOSIS — R931 Abnormal findings on diagnostic imaging of heart and coronary circulation: Secondary | ICD-10-CM | POA: Diagnosis not present

## 2022-06-02 DIAGNOSIS — N1831 Chronic kidney disease, stage 3a: Secondary | ICD-10-CM | POA: Diagnosis not present

## 2022-06-05 ENCOUNTER — Other Ambulatory Visit: Payer: Self-pay | Admitting: Internal Medicine

## 2022-06-05 DIAGNOSIS — E042 Nontoxic multinodular goiter: Secondary | ICD-10-CM

## 2022-06-07 ENCOUNTER — Other Ambulatory Visit (HOSPITAL_COMMUNITY): Payer: Self-pay

## 2022-06-20 DIAGNOSIS — Z1211 Encounter for screening for malignant neoplasm of colon: Secondary | ICD-10-CM | POA: Diagnosis not present

## 2022-06-20 DIAGNOSIS — Z1212 Encounter for screening for malignant neoplasm of rectum: Secondary | ICD-10-CM | POA: Diagnosis not present

## 2022-06-23 ENCOUNTER — Ambulatory Visit
Admission: RE | Admit: 2022-06-23 | Discharge: 2022-06-23 | Disposition: A | Discharge status: Home / Self Care | Payer: 59 | Source: Ambulatory Visit | Attending: Internal Medicine | Admitting: Internal Medicine

## 2022-06-23 DIAGNOSIS — E042 Nontoxic multinodular goiter: Secondary | ICD-10-CM | POA: Diagnosis not present

## 2022-06-27 DIAGNOSIS — Z23 Encounter for immunization: Secondary | ICD-10-CM | POA: Diagnosis not present

## 2022-06-27 DIAGNOSIS — R972 Elevated prostate specific antigen [PSA]: Secondary | ICD-10-CM | POA: Diagnosis not present

## 2022-06-30 ENCOUNTER — Other Ambulatory Visit (HOSPITAL_COMMUNITY): Payer: Self-pay

## 2022-07-03 ENCOUNTER — Other Ambulatory Visit: Payer: Self-pay | Admitting: Pharmacist

## 2022-07-03 ENCOUNTER — Other Ambulatory Visit (HOSPITAL_COMMUNITY): Payer: Self-pay

## 2022-07-03 MED ORDER — ENBREL SURECLICK 50 MG/ML ~~LOC~~ SOAJ
SUBCUTANEOUS | 5 refills | Status: DC
  Filled 2022-07-03: qty 4, 28d supply, fill #0
  Filled 2022-07-27 – 2022-08-09 (×3): qty 4, 28d supply, fill #1

## 2022-07-03 MED ORDER — ENBREL SURECLICK 50 MG/ML ~~LOC~~ SOAJ: SUBCUTANEOUS | 5 refills | Status: DC

## 2022-07-05 ENCOUNTER — Other Ambulatory Visit (HOSPITAL_COMMUNITY): Payer: Self-pay

## 2022-07-07 DIAGNOSIS — K4020 Bilateral inguinal hernia, without obstruction or gangrene, not specified as recurrent: Secondary | ICD-10-CM | POA: Diagnosis not present

## 2022-07-20 ENCOUNTER — Other Ambulatory Visit (HOSPITAL_COMMUNITY): Payer: Self-pay

## 2022-07-21 ENCOUNTER — Other Ambulatory Visit (HOSPITAL_COMMUNITY): Payer: Self-pay

## 2022-07-24 ENCOUNTER — Other Ambulatory Visit (HOSPITAL_COMMUNITY): Payer: Self-pay

## 2022-07-24 MED ORDER — METHOTREXATE SODIUM 2.5 MG PO TABS
10.0000 mg | ORAL_TABLET | ORAL | 2 refills | Status: DC
  Filled 2022-07-24: qty 52, 90d supply, fill #0
  Filled 2022-10-24: qty 52, 90d supply, fill #1
  Filled 2023-01-24: qty 52, 90d supply, fill #2

## 2022-07-25 DIAGNOSIS — Z111 Encounter for screening for respiratory tuberculosis: Secondary | ICD-10-CM | POA: Diagnosis not present

## 2022-07-25 DIAGNOSIS — M7989 Other specified soft tissue disorders: Secondary | ICD-10-CM | POA: Diagnosis not present

## 2022-07-25 DIAGNOSIS — Z6824 Body mass index (BMI) 24.0-24.9, adult: Secondary | ICD-10-CM | POA: Diagnosis not present

## 2022-07-25 DIAGNOSIS — M0589 Other rheumatoid arthritis with rheumatoid factor of multiple sites: Secondary | ICD-10-CM | POA: Diagnosis not present

## 2022-07-25 DIAGNOSIS — R5383 Other fatigue: Secondary | ICD-10-CM | POA: Diagnosis not present

## 2022-07-25 DIAGNOSIS — M1991 Primary osteoarthritis, unspecified site: Secondary | ICD-10-CM | POA: Diagnosis not present

## 2022-07-27 ENCOUNTER — Other Ambulatory Visit (HOSPITAL_COMMUNITY): Payer: Self-pay

## 2022-07-28 ENCOUNTER — Other Ambulatory Visit (HOSPITAL_COMMUNITY): Payer: Self-pay

## 2022-07-28 DIAGNOSIS — R195 Other fecal abnormalities: Secondary | ICD-10-CM | POA: Diagnosis not present

## 2022-08-07 ENCOUNTER — Other Ambulatory Visit (HOSPITAL_COMMUNITY): Payer: Self-pay

## 2022-08-08 ENCOUNTER — Other Ambulatory Visit (HOSPITAL_COMMUNITY): Payer: Self-pay

## 2022-08-09 ENCOUNTER — Other Ambulatory Visit (HOSPITAL_COMMUNITY): Payer: Self-pay

## 2022-08-10 ENCOUNTER — Other Ambulatory Visit: Payer: Self-pay

## 2022-08-10 ENCOUNTER — Other Ambulatory Visit (HOSPITAL_COMMUNITY): Payer: Self-pay

## 2022-08-12 ENCOUNTER — Other Ambulatory Visit (HOSPITAL_COMMUNITY): Payer: Self-pay

## 2022-08-12 MED ORDER — BISACODYL 5 MG PO TBEC
DELAYED_RELEASE_TABLET | ORAL | 0 refills | Status: AC
  Filled 2022-08-12: qty 25, 15d supply, fill #0

## 2022-08-12 MED ORDER — PEG 3350-KCL-NABCB-NACL-NASULF 236 G PO SOLR
ORAL | 0 refills | Status: AC
  Filled 2022-08-12: qty 4000, 1d supply, fill #0

## 2022-08-26 DIAGNOSIS — R55 Syncope and collapse: Secondary | ICD-10-CM | POA: Diagnosis not present

## 2022-08-30 ENCOUNTER — Other Ambulatory Visit (HOSPITAL_COMMUNITY): Payer: Self-pay

## 2022-08-30 MED ORDER — ENBREL SURECLICK 50 MG/ML ~~LOC~~ SOAJ
SUBCUTANEOUS | 5 refills | Status: DC
  Filled 2022-08-31: qty 4, 28d supply, fill #0
  Filled 2022-09-25: qty 4, 28d supply, fill #1
  Filled 2022-10-24: qty 4, 28d supply, fill #2
  Filled 2022-11-24: qty 4, 28d supply, fill #3
  Filled 2022-12-20: qty 4, 28d supply, fill #4
  Filled 2023-01-24: qty 4, 28d supply, fill #5

## 2022-08-31 ENCOUNTER — Other Ambulatory Visit (HOSPITAL_COMMUNITY): Payer: Self-pay

## 2022-09-01 ENCOUNTER — Other Ambulatory Visit (HOSPITAL_COMMUNITY): Payer: Self-pay

## 2022-09-04 ENCOUNTER — Other Ambulatory Visit: Payer: Self-pay

## 2022-09-21 ENCOUNTER — Other Ambulatory Visit (HOSPITAL_COMMUNITY): Payer: Self-pay

## 2022-09-25 ENCOUNTER — Other Ambulatory Visit (HOSPITAL_COMMUNITY): Payer: Self-pay

## 2022-09-25 DIAGNOSIS — R195 Other fecal abnormalities: Secondary | ICD-10-CM | POA: Diagnosis not present

## 2022-09-25 DIAGNOSIS — D123 Benign neoplasm of transverse colon: Secondary | ICD-10-CM | POA: Diagnosis not present

## 2022-09-25 DIAGNOSIS — D125 Benign neoplasm of sigmoid colon: Secondary | ICD-10-CM | POA: Diagnosis not present

## 2022-09-25 DIAGNOSIS — K5730 Diverticulosis of large intestine without perforation or abscess without bleeding: Secondary | ICD-10-CM | POA: Diagnosis not present

## 2022-09-25 DIAGNOSIS — K648 Other hemorrhoids: Secondary | ICD-10-CM | POA: Diagnosis not present

## 2022-09-29 DIAGNOSIS — D123 Benign neoplasm of transverse colon: Secondary | ICD-10-CM | POA: Diagnosis not present

## 2022-10-04 ENCOUNTER — Other Ambulatory Visit: Payer: Self-pay

## 2022-10-05 ENCOUNTER — Other Ambulatory Visit: Payer: Self-pay

## 2022-10-24 ENCOUNTER — Other Ambulatory Visit (HOSPITAL_COMMUNITY): Payer: Self-pay

## 2022-11-01 ENCOUNTER — Other Ambulatory Visit (HOSPITAL_COMMUNITY): Payer: Self-pay

## 2022-11-02 ENCOUNTER — Other Ambulatory Visit (HOSPITAL_COMMUNITY): Payer: Self-pay

## 2022-11-18 IMAGING — CT CT CARDIAC CORONARY ARTERY CALCIUM SCORE
3 series · 14 of 20 positions shown, 16 images · non-contrast
Comparison: None.

CLINICAL DATA: 65-year-old Caucasian male with history of
hyperlipidemia, hypertension and family history of heart disease.

EXAM:
CT CARDIAC CORONARY ARTERY CALCIUM SCORE
TECHNIQUE: Non-contrast imaging through the heart was performed using
prospective ECG gating. Image post processing was performed on an
independent workstation, allowing for quantitative analysis of the
heart and coronary arteries. Note that this exam targets the heart
and the chest was not imaged in its entirety.

[Series 2: calcium scoring 2.00 qr36 bestdiast 68% hrt calciu · axial · 0.40mm/px · z∈[+1701,+1797]mm · 4 of 80 slices shown]
[im 16/80  vessel]
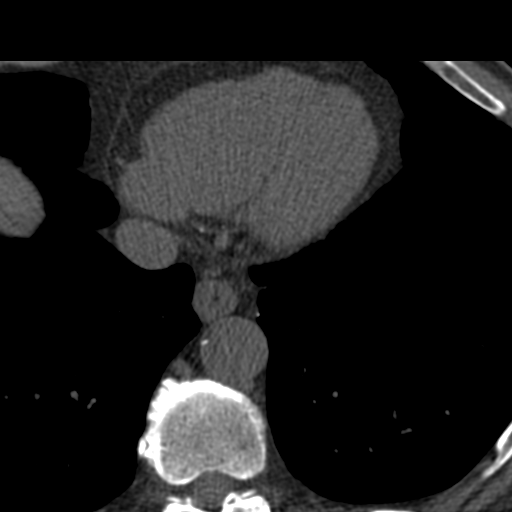
[im 32/80  vessel]
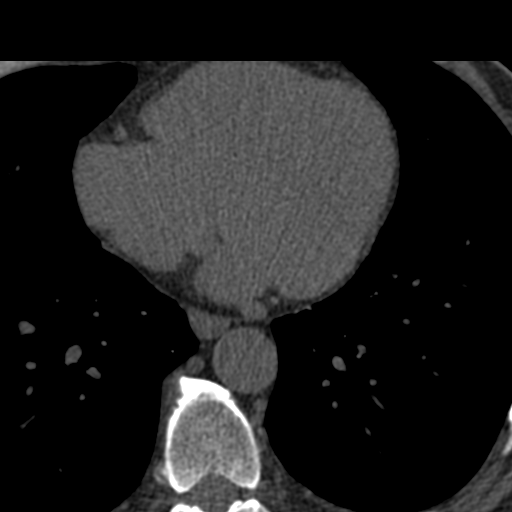
[im 48/80  vessel]
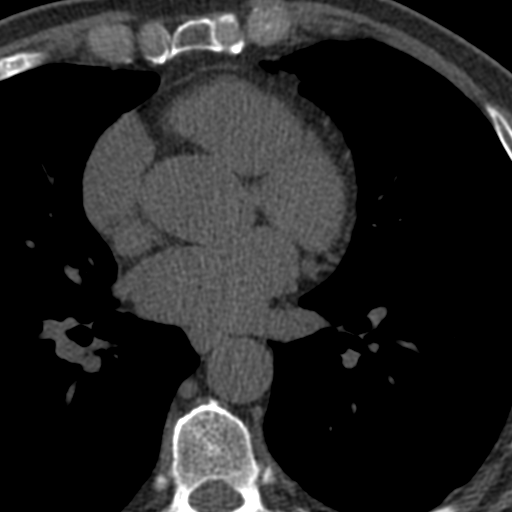
[im 64/80  vessel]
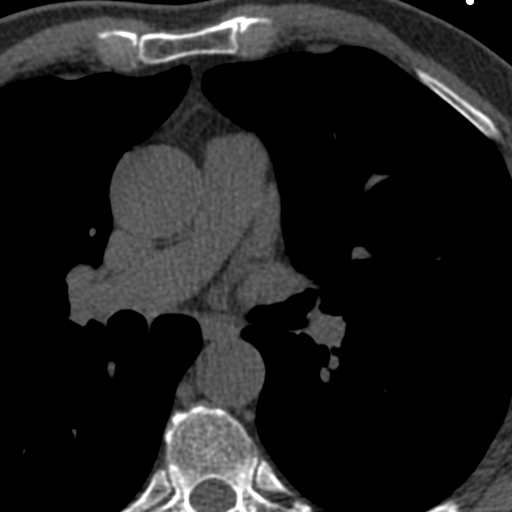

[Series 3: calcium scoring 2.00 br40 bestdiast 68% axial · axial · 0.55mm/px · z∈[+1697,+1801]mm · 5 of 80 slices shown, 7 images]
[im 14/80  vessel]
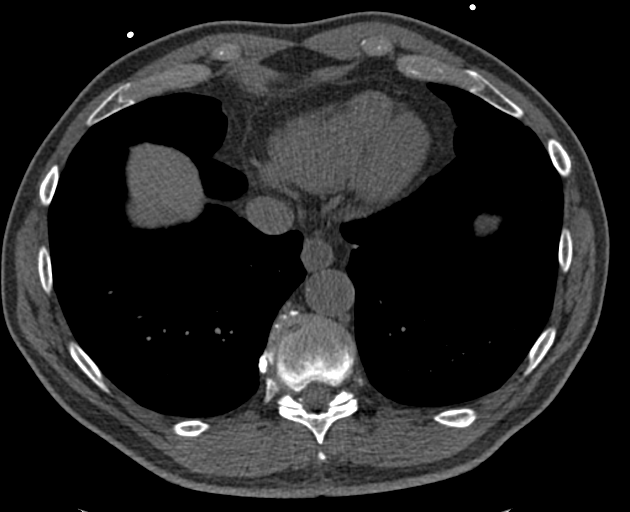
[im 14/80  lung]
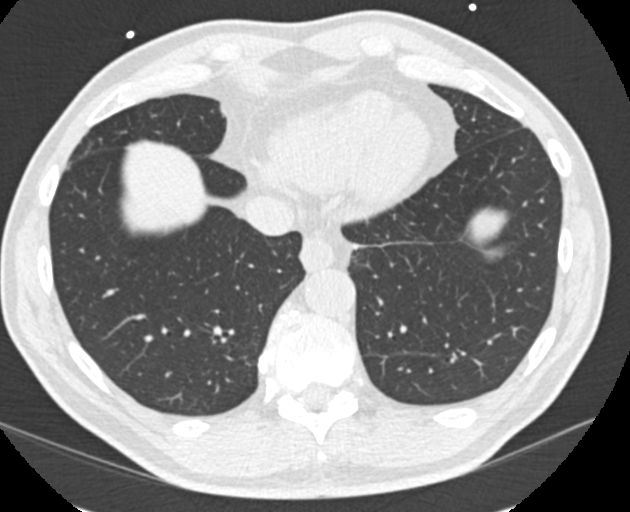
[im 27/80  vessel]
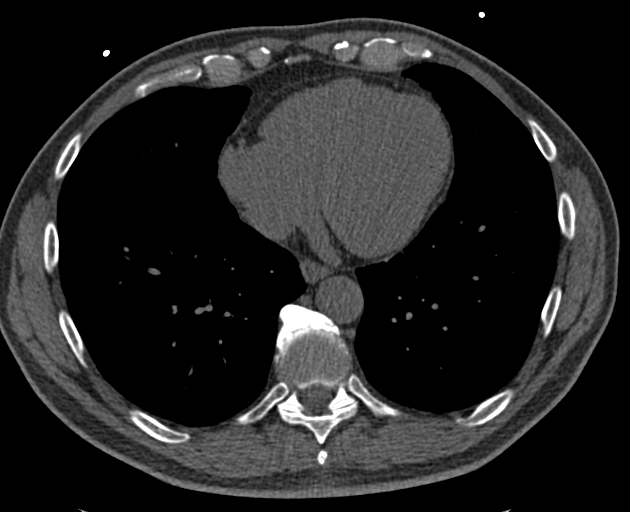
[im 40/80  vessel]
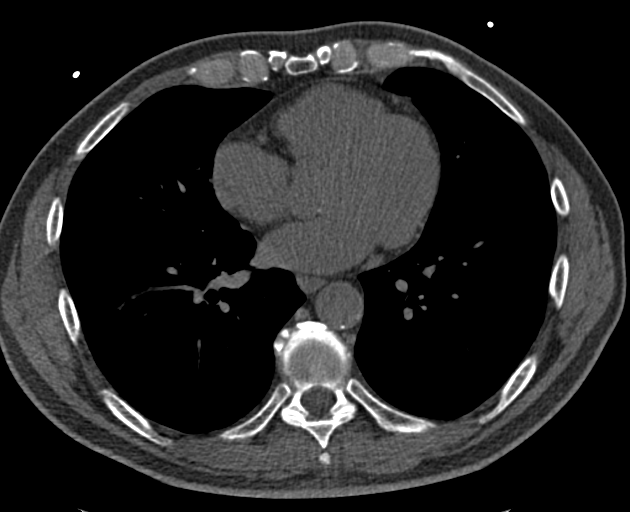
[im 53/80  vessel]
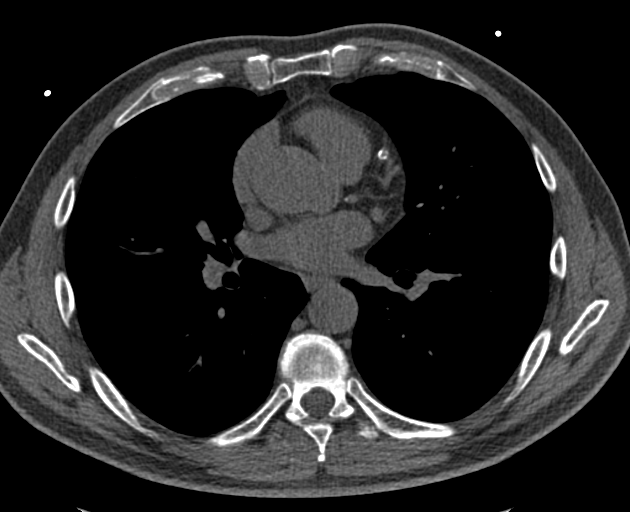
[im 66/80  vessel]
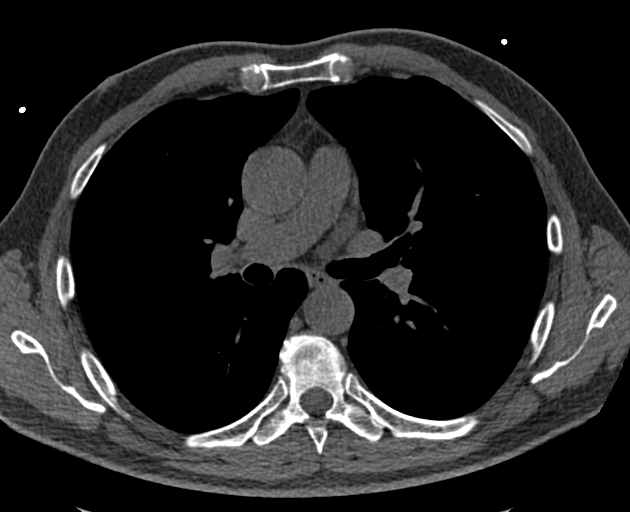
[im 66/80  lung]
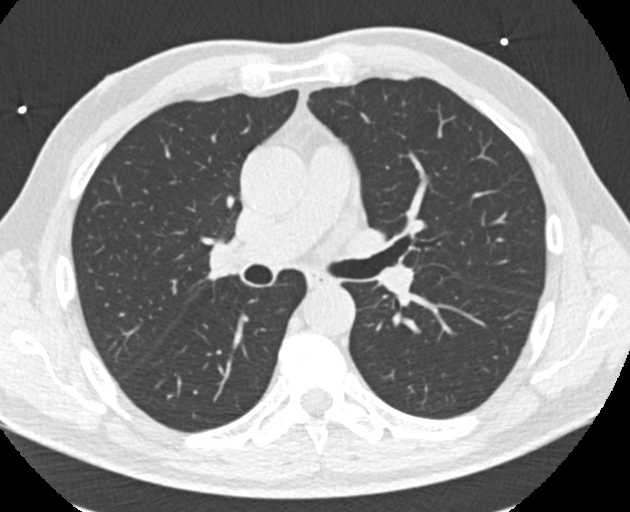

[Series 9: calcium scoring 2.00 br60 bestdiast 68% lungs · axial · 0.54mm/px · z∈[+1697,+1801]mm · 5 of 80 slices shown]
[im 14/80  vessel]
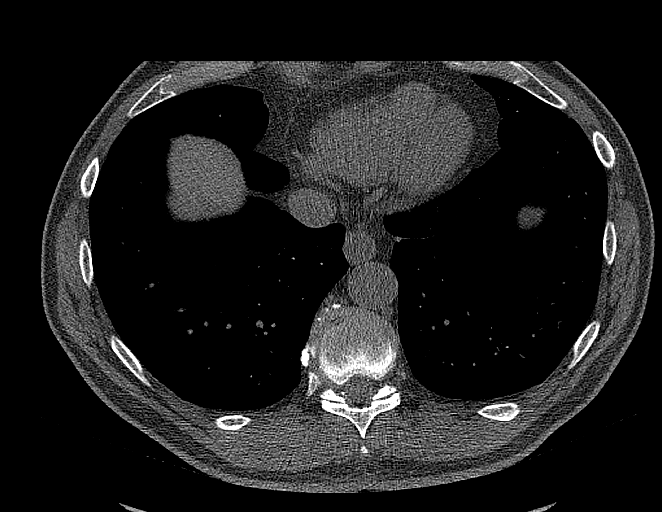
[im 27/80  vessel]
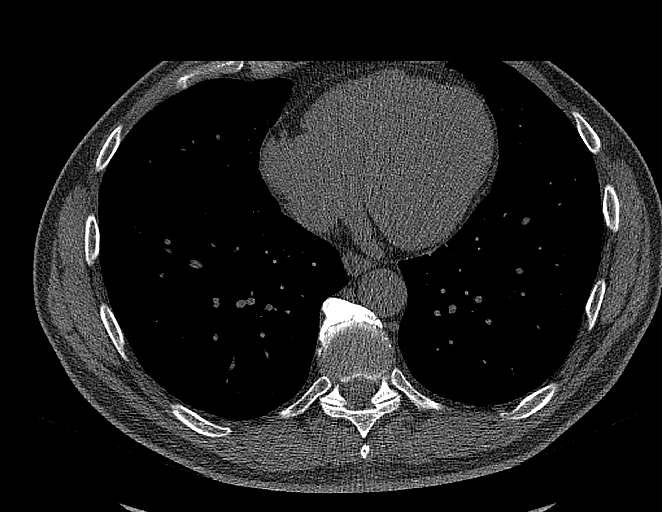
[im 40/80  vessel]
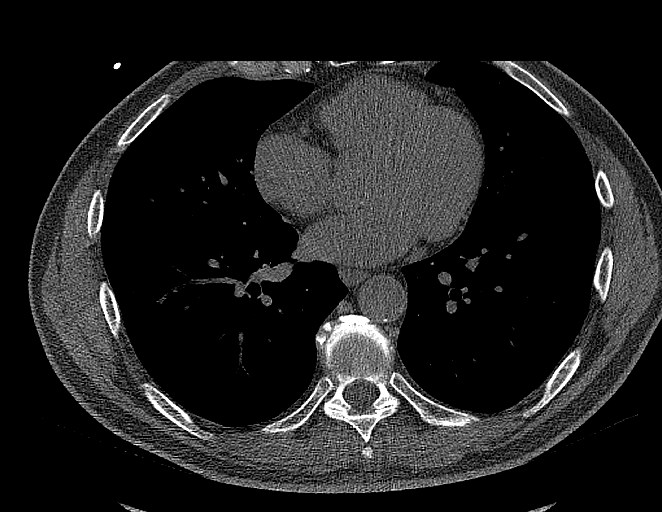
[im 53/80  vessel]
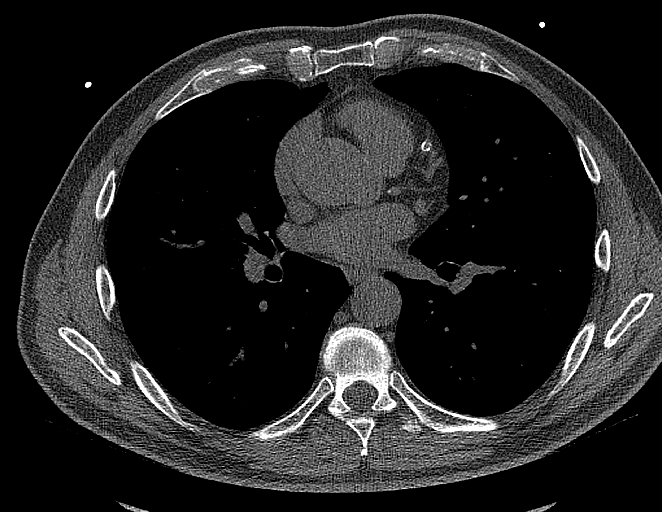
[im 66/80  vessel]
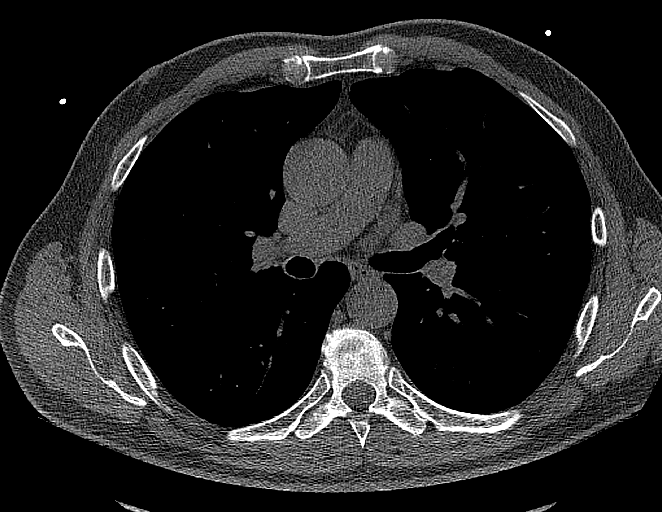

[14 of 20 positions shown; findings below may reference images not displayed]

FINDINGS: CORONARY CALCIUM SCORES:

Left Main: 0

LAD: 244

LCx: 28

RCA:

Total Agatston Score:

[HOSPITAL] percentile: 70

AORTA MEASUREMENTS:

Ascending Aorta: 36 mm

Descending Aorta: 26 mm

OTHER FINDINGS:

The heart size is within normal limits. No pericardial fluid is
identified. Atherosclerosis of the descending thoracic aorta.
Visualized segments of the thoracic aorta and central pulmonary
arteries are normal in caliber. Small hiatal hernia. Visualized
mediastinum and hilar regions demonstrate no lymphadenopathy or
masses. Visualized lungs show no evidence of pulmonary edema,
consolidation, pneumothorax, nodule or pleural fluid. Visualized
upper abdomen and bony structures are unremarkable.
IMPRESSION: 1. Coronary calcium score of 273.4 is at the 70th percentile for the
patient's age, sex and race.
2. Small hiatal hernia.
3. Atherosclerosis of the descending thoracic aorta.

## 2022-11-22 ENCOUNTER — Other Ambulatory Visit (HOSPITAL_COMMUNITY): Payer: Self-pay

## 2022-11-24 ENCOUNTER — Other Ambulatory Visit (HOSPITAL_COMMUNITY): Payer: Self-pay

## 2022-11-28 ENCOUNTER — Other Ambulatory Visit: Payer: Self-pay

## 2022-12-12 DIAGNOSIS — Z6823 Body mass index (BMI) 23.0-23.9, adult: Secondary | ICD-10-CM | POA: Diagnosis not present

## 2022-12-12 DIAGNOSIS — M1991 Primary osteoarthritis, unspecified site: Secondary | ICD-10-CM | POA: Diagnosis not present

## 2022-12-12 DIAGNOSIS — M0589 Other rheumatoid arthritis with rheumatoid factor of multiple sites: Secondary | ICD-10-CM | POA: Diagnosis not present

## 2022-12-20 ENCOUNTER — Other Ambulatory Visit (HOSPITAL_COMMUNITY): Payer: Self-pay

## 2022-12-27 ENCOUNTER — Other Ambulatory Visit (HOSPITAL_COMMUNITY): Payer: Self-pay

## 2023-01-24 ENCOUNTER — Other Ambulatory Visit (HOSPITAL_COMMUNITY): Payer: Self-pay

## 2023-01-24 ENCOUNTER — Other Ambulatory Visit: Payer: Self-pay

## 2023-01-24 MED ORDER — FOLIC ACID 1 MG PO TABS
1.0000 mg | ORAL_TABLET | Freq: Every day | ORAL | 3 refills | Status: DC
  Filled 2023-01-24: qty 90, 90d supply, fill #0
  Filled 2023-04-19: qty 90, 90d supply, fill #1
  Filled 2023-07-13 – 2023-07-20 (×2): qty 90, 90d supply, fill #2
  Filled 2023-10-23: qty 90, 90d supply, fill #3

## 2023-01-24 MED ORDER — ATORVASTATIN CALCIUM 40 MG PO TABS
20.0000 mg | ORAL_TABLET | Freq: Every day | ORAL | 2 refills | Status: DC
  Filled 2023-01-24: qty 45, 90d supply, fill #0
  Filled 2023-04-19: qty 45, 90d supply, fill #1
  Filled 2023-07-13 – 2023-07-20 (×2): qty 45, 90d supply, fill #2

## 2023-01-24 MED ORDER — LISINOPRIL-HYDROCHLOROTHIAZIDE 20-12.5 MG PO TABS
1.0000 | ORAL_TABLET | Freq: Every morning | ORAL | 3 refills | Status: DC
  Filled 2023-01-24: qty 90, 90d supply, fill #0
  Filled 2023-04-19: qty 90, 90d supply, fill #1
  Filled 2023-07-13 – 2023-07-20 (×2): qty 90, 90d supply, fill #2
  Filled 2023-10-23: qty 90, 90d supply, fill #3

## 2023-01-31 ENCOUNTER — Other Ambulatory Visit (HOSPITAL_COMMUNITY): Payer: Self-pay

## 2023-02-16 ENCOUNTER — Other Ambulatory Visit: Payer: Self-pay

## 2023-02-19 ENCOUNTER — Other Ambulatory Visit (HOSPITAL_COMMUNITY): Payer: Self-pay

## 2023-02-19 ENCOUNTER — Other Ambulatory Visit: Payer: Self-pay

## 2023-02-19 MED ORDER — ENBREL SURECLICK 50 MG/ML ~~LOC~~ SOAJ
SUBCUTANEOUS | 5 refills | Status: DC
  Filled 2023-02-19: qty 4, 28d supply, fill #0
  Filled 2023-03-26: qty 4, 28d supply, fill #1
  Filled 2023-04-19: qty 4, 28d supply, fill #2
  Filled 2023-05-11: qty 4, 28d supply, fill #3
  Filled 2023-06-15: qty 4, 28d supply, fill #4
  Filled 2023-07-09: qty 4, 28d supply, fill #5

## 2023-02-21 ENCOUNTER — Other Ambulatory Visit (HOSPITAL_COMMUNITY): Payer: Self-pay

## 2023-02-28 ENCOUNTER — Telehealth: Payer: Self-pay | Admitting: Pharmacist

## 2023-02-28 NOTE — Telephone Encounter (Signed)
Called patient to schedule an appointment for the [facility]. I was unable to reach the patient so I left a HIPAA-compliant message requesting that the patient return my call.   Luke Van Ausdall, PharmD, BCACP, CPP Clinical Pharmacist [facility] [phone]  

## 2023-03-13 DIAGNOSIS — M0589 Other rheumatoid arthritis with rheumatoid factor of multiple sites: Secondary | ICD-10-CM | POA: Diagnosis not present

## 2023-03-20 ENCOUNTER — Other Ambulatory Visit (HOSPITAL_COMMUNITY): Payer: Self-pay

## 2023-03-23 ENCOUNTER — Other Ambulatory Visit (HOSPITAL_COMMUNITY): Payer: Self-pay

## 2023-03-26 ENCOUNTER — Other Ambulatory Visit (HOSPITAL_COMMUNITY): Payer: Self-pay

## 2023-03-27 ENCOUNTER — Other Ambulatory Visit: Payer: Self-pay

## 2023-04-19 ENCOUNTER — Other Ambulatory Visit (HOSPITAL_COMMUNITY): Payer: Self-pay

## 2023-04-20 ENCOUNTER — Other Ambulatory Visit: Payer: Self-pay

## 2023-04-20 ENCOUNTER — Other Ambulatory Visit (HOSPITAL_COMMUNITY): Payer: Self-pay

## 2023-04-20 MED ORDER — METHOTREXATE SODIUM 2.5 MG PO TABS
10.0000 mg | ORAL_TABLET | ORAL | 3 refills | Status: DC
  Filled 2023-04-20: qty 52, 90d supply, fill #0
  Filled 2023-07-13 – 2023-07-20 (×2): qty 52, 90d supply, fill #1
  Filled 2023-10-23: qty 52, 90d supply, fill #2
  Filled 2024-01-21: qty 52, 90d supply, fill #3

## 2023-04-24 ENCOUNTER — Other Ambulatory Visit (HOSPITAL_COMMUNITY): Payer: Self-pay

## 2023-05-11 ENCOUNTER — Other Ambulatory Visit (HOSPITAL_COMMUNITY): Payer: Self-pay

## 2023-05-19 ENCOUNTER — Encounter (HOSPITAL_COMMUNITY): Payer: Self-pay

## 2023-06-05 DIAGNOSIS — E039 Hypothyroidism, unspecified: Secondary | ICD-10-CM | POA: Diagnosis not present

## 2023-06-08 DIAGNOSIS — Z79899 Other long term (current) drug therapy: Secondary | ICD-10-CM | POA: Diagnosis not present

## 2023-06-08 DIAGNOSIS — Z23 Encounter for immunization: Secondary | ICD-10-CM | POA: Diagnosis not present

## 2023-06-08 DIAGNOSIS — I2510 Atherosclerotic heart disease of native coronary artery without angina pectoris: Secondary | ICD-10-CM | POA: Diagnosis not present

## 2023-06-08 DIAGNOSIS — E039 Hypothyroidism, unspecified: Secondary | ICD-10-CM | POA: Diagnosis not present

## 2023-06-08 DIAGNOSIS — Z0000 Encounter for general adult medical examination without abnormal findings: Secondary | ICD-10-CM | POA: Diagnosis not present

## 2023-06-08 DIAGNOSIS — K4020 Bilateral inguinal hernia, without obstruction or gangrene, not specified as recurrent: Secondary | ICD-10-CM | POA: Diagnosis not present

## 2023-06-08 DIAGNOSIS — Z1212 Encounter for screening for malignant neoplasm of rectum: Secondary | ICD-10-CM | POA: Diagnosis not present

## 2023-06-08 DIAGNOSIS — M069 Rheumatoid arthritis, unspecified: Secondary | ICD-10-CM | POA: Diagnosis not present

## 2023-06-08 DIAGNOSIS — I10 Essential (primary) hypertension: Secondary | ICD-10-CM | POA: Diagnosis not present

## 2023-06-08 DIAGNOSIS — E042 Nontoxic multinodular goiter: Secondary | ICD-10-CM | POA: Diagnosis not present

## 2023-06-08 DIAGNOSIS — R931 Abnormal findings on diagnostic imaging of heart and coronary circulation: Secondary | ICD-10-CM | POA: Diagnosis not present

## 2023-06-08 DIAGNOSIS — N1831 Chronic kidney disease, stage 3a: Secondary | ICD-10-CM | POA: Diagnosis not present

## 2023-06-12 DIAGNOSIS — M0589 Other rheumatoid arthritis with rheumatoid factor of multiple sites: Secondary | ICD-10-CM | POA: Diagnosis not present

## 2023-06-12 DIAGNOSIS — Z6823 Body mass index (BMI) 23.0-23.9, adult: Secondary | ICD-10-CM | POA: Diagnosis not present

## 2023-06-12 DIAGNOSIS — M1991 Primary osteoarthritis, unspecified site: Secondary | ICD-10-CM | POA: Diagnosis not present

## 2023-06-15 ENCOUNTER — Other Ambulatory Visit: Payer: Self-pay

## 2023-06-15 NOTE — Progress Notes (Signed)
Specialty Pharmacy Refill Coordination Note  Bobby Clay is a 67 y.o. male contacted today regarding refills of specialty medication(s) Etanercept   Patient requested Delivery   Delivery date: 06/19/23   Verified address: [address]   [place] [address]   Medication will be filled on 06/18/23.

## 2023-07-09 ENCOUNTER — Other Ambulatory Visit (HOSPITAL_COMMUNITY): Payer: Self-pay

## 2023-07-09 ENCOUNTER — Encounter (HOSPITAL_COMMUNITY): Payer: Self-pay

## 2023-07-09 NOTE — Progress Notes (Signed)
Specialty Pharmacy Refill Coordination Note  Bobby Clay is a 67 y.o. male contacted today regarding refills of specialty medication(s) Etanercept   Patient requested Delivery   Delivery date: 07/13/23   Verified address: [address] [place] [address]   Medication will be filled on 07/12/23.

## 2023-07-09 NOTE — Progress Notes (Signed)
Specialty Pharmacy Ongoing Clinical Assessment Note  Bobby Clay is a 67 y.o. male who is being followed by the specialty pharmacy service for RxSp Rheumatoid Arthritis   Patient's specialty medication(s) reviewed today: Etanercept   Missed doses in the last 4 weeks: 0   Patient/Caregiver did not have any additional questions or concerns.   Therapeutic benefit summary: Patient is achieving benefit   Adverse events/side effects summary: No adverse events/side effects   Patient's therapy is appropriate to: Continue    Goals Addressed             This Visit's Progress    Minimize recurrence of flares       Patient is on track. Patient will maintain adherence.  Patient reports that he is well-controlled at this time.          Follow up:  6 months  Servando Snare Specialty Pharmacist

## 2023-07-13 ENCOUNTER — Other Ambulatory Visit (HOSPITAL_COMMUNITY): Payer: Self-pay

## 2023-07-14 ENCOUNTER — Other Ambulatory Visit (HOSPITAL_COMMUNITY): Payer: Self-pay

## 2023-07-20 ENCOUNTER — Other Ambulatory Visit (HOSPITAL_COMMUNITY): Payer: Self-pay

## 2023-08-02 ENCOUNTER — Other Ambulatory Visit: Payer: Self-pay

## 2023-08-02 ENCOUNTER — Other Ambulatory Visit (HOSPITAL_COMMUNITY): Payer: Self-pay

## 2023-08-02 MED ORDER — ENBREL SURECLICK 50 MG/ML ~~LOC~~ SOAJ
SUBCUTANEOUS | 5 refills | Status: DC
  Filled 2023-08-02: qty 4, 28d supply, fill #0
  Filled 2023-09-03 (×2): qty 4, 28d supply, fill #1
  Filled 2023-09-27: qty 4, 28d supply, fill #2
  Filled 2023-10-24: qty 4, 28d supply, fill #3
  Filled 2023-11-21: qty 4, 28d supply, fill #4
  Filled 2023-12-21: qty 4, 28d supply, fill #5

## 2023-08-02 NOTE — Progress Notes (Signed)
Specialty Pharmacy Refill Coordination Note  Bobby Clay is a 67 y.o. male contacted today regarding refills of specialty medication(s) Etanercept   Patient requested Delivery   Delivery date: 08/09/23   Verified address: [address]   Medication will be filled on 08/08/23. Pending Refill Request.   Patient has 1 dose on hand for Tuesday, 12/10.

## 2023-08-08 ENCOUNTER — Other Ambulatory Visit: Payer: Self-pay

## 2023-09-03 ENCOUNTER — Other Ambulatory Visit: Payer: Self-pay

## 2023-09-03 NOTE — Progress Notes (Signed)
 Specialty Pharmacy Refill Coordination Note  Bobby Clay is a 68 y.o. male contacted today regarding refills of specialty medication(s) Etanercept  (Enbrel  SureClick)   Patient requested Delivery   Delivery date: 09/11/23   Verified address: [address]  [place],  [place] [address]   Medication will be filled on 09/10/23.

## 2023-09-04 ENCOUNTER — Other Ambulatory Visit: Payer: Self-pay

## 2023-09-04 NOTE — Progress Notes (Signed)
 Copay is $2,000. Patient is paying full amount

## 2023-09-10 ENCOUNTER — Other Ambulatory Visit: Payer: Self-pay

## 2023-09-11 DIAGNOSIS — M0589 Other rheumatoid arthritis with rheumatoid factor of multiple sites: Secondary | ICD-10-CM | POA: Diagnosis not present

## 2023-09-27 ENCOUNTER — Other Ambulatory Visit (HOSPITAL_COMMUNITY): Payer: Self-pay

## 2023-09-27 ENCOUNTER — Other Ambulatory Visit: Payer: Self-pay | Admitting: Pharmacy Technician

## 2023-09-27 ENCOUNTER — Other Ambulatory Visit: Payer: Self-pay

## 2023-09-27 NOTE — Progress Notes (Signed)
Specialty Pharmacy Refill Coordination Note  Bobby Clay is a 68 y.o. male contacted today regarding refills of specialty medication(s) Etanercept (Enbrel SureClick)   Patient requested Delivery   Delivery date: 10/04/23   Verified address: Patient address [address]  [place]   Medication will be filled on 10/03/23.

## 2023-10-03 ENCOUNTER — Other Ambulatory Visit: Payer: Self-pay

## 2023-10-18 ENCOUNTER — Other Ambulatory Visit: Payer: Self-pay

## 2023-10-23 ENCOUNTER — Other Ambulatory Visit: Payer: Self-pay

## 2023-10-23 ENCOUNTER — Other Ambulatory Visit (HOSPITAL_COMMUNITY): Payer: Self-pay

## 2023-10-23 MED ORDER — ATORVASTATIN CALCIUM 40 MG PO TABS
20.0000 mg | ORAL_TABLET | Freq: Every day | ORAL | 2 refills | Status: DC
  Filled 2023-10-23: qty 45, 90d supply, fill #0
  Filled 2024-01-21: qty 45, 90d supply, fill #1
  Filled 2024-04-21: qty 45, 90d supply, fill #2

## 2023-10-24 ENCOUNTER — Other Ambulatory Visit: Payer: Self-pay

## 2023-10-24 NOTE — Progress Notes (Signed)
 Specialty Pharmacy Refill Coordination Note  Bobby Clay is a 68 y.o. male contacted today regarding refills of specialty medication(s) Etanercept (Enbrel SureClick)   Patient requested Delivery   Delivery date: 11/01/23   Verified address: Patient address [address]  [place]    Medication will be filled on 03.05.25.

## 2023-10-31 ENCOUNTER — Other Ambulatory Visit: Payer: Self-pay

## 2023-10-31 DIAGNOSIS — K4020 Bilateral inguinal hernia, without obstruction or gangrene, not specified as recurrent: Secondary | ICD-10-CM | POA: Diagnosis not present

## 2023-10-31 DIAGNOSIS — Z131 Encounter for screening for diabetes mellitus: Secondary | ICD-10-CM | POA: Diagnosis not present

## 2023-10-31 DIAGNOSIS — Z01810 Encounter for preprocedural cardiovascular examination: Secondary | ICD-10-CM | POA: Diagnosis not present

## 2023-11-21 ENCOUNTER — Other Ambulatory Visit: Payer: Self-pay | Admitting: Pharmacy Technician

## 2023-11-21 ENCOUNTER — Other Ambulatory Visit: Payer: Self-pay

## 2023-11-21 NOTE — Progress Notes (Signed)
 Specialty Pharmacy Refill Coordination Note  Bobby Clay is a 68 y.o. male contacted today regarding refills of specialty medication(s) Etanercept (Enbrel SureClick)   Patient requested Delivery   Delivery date: 11/29/23   Verified address: [address] [place]   Medication will be filled on 11/28/23.

## 2023-11-29 DIAGNOSIS — E7800 Pure hypercholesterolemia, unspecified: Secondary | ICD-10-CM | POA: Diagnosis not present

## 2023-11-29 DIAGNOSIS — I129 Hypertensive chronic kidney disease with stage 1 through stage 4 chronic kidney disease, or unspecified chronic kidney disease: Secondary | ICD-10-CM | POA: Diagnosis not present

## 2023-11-29 DIAGNOSIS — I2510 Atherosclerotic heart disease of native coronary artery without angina pectoris: Secondary | ICD-10-CM | POA: Diagnosis not present

## 2023-11-29 DIAGNOSIS — D176 Benign lipomatous neoplasm of spermatic cord: Secondary | ICD-10-CM | POA: Diagnosis not present

## 2023-11-29 DIAGNOSIS — Z7982 Long term (current) use of aspirin: Secondary | ICD-10-CM | POA: Diagnosis not present

## 2023-11-29 DIAGNOSIS — N1831 Chronic kidney disease, stage 3a: Secondary | ICD-10-CM | POA: Diagnosis not present

## 2023-11-29 DIAGNOSIS — K4020 Bilateral inguinal hernia, without obstruction or gangrene, not specified as recurrent: Secondary | ICD-10-CM | POA: Diagnosis not present

## 2023-11-29 DIAGNOSIS — Z9049 Acquired absence of other specified parts of digestive tract: Secondary | ICD-10-CM | POA: Diagnosis not present

## 2023-11-29 DIAGNOSIS — M069 Rheumatoid arthritis, unspecified: Secondary | ICD-10-CM | POA: Diagnosis not present

## 2023-11-29 DIAGNOSIS — Z79899 Other long term (current) drug therapy: Secondary | ICD-10-CM | POA: Diagnosis not present

## 2023-12-18 DIAGNOSIS — Z111 Encounter for screening for respiratory tuberculosis: Secondary | ICD-10-CM | POA: Diagnosis not present

## 2023-12-18 DIAGNOSIS — M1991 Primary osteoarthritis, unspecified site: Secondary | ICD-10-CM | POA: Diagnosis not present

## 2023-12-18 DIAGNOSIS — Z6824 Body mass index (BMI) 24.0-24.9, adult: Secondary | ICD-10-CM | POA: Diagnosis not present

## 2023-12-18 DIAGNOSIS — M0589 Other rheumatoid arthritis with rheumatoid factor of multiple sites: Secondary | ICD-10-CM | POA: Diagnosis not present

## 2023-12-19 ENCOUNTER — Other Ambulatory Visit: Payer: Self-pay

## 2023-12-20 ENCOUNTER — Other Ambulatory Visit (HOSPITAL_COMMUNITY): Payer: Self-pay

## 2023-12-21 ENCOUNTER — Other Ambulatory Visit: Payer: Self-pay

## 2023-12-21 NOTE — Progress Notes (Signed)
 Specialty Pharmacy Ongoing Clinical Assessment Note  Bobby Clay is a 68 y.o. male who is being followed by the specialty pharmacy service for RxSp Rheumatoid Arthritis   Patient's specialty medication(s) reviewed today: Etanercept  (Enbrel  SureClick)   Missed doses in the last 4 weeks: 0   Patient/Caregiver did not have any additional questions or concerns.   Therapeutic benefit summary: Patient is achieving benefit   Adverse events/side effects summary: No adverse events/side effects   Patient's therapy is appropriate to: Continue    Goals Addressed             This Visit's Progress    Minimize recurrence of flares   On track    Patient is on track. Patient will maintain adherence.  Patient reports that he is well-controlled at this time.          Follow up:  6 months  Melinna Linarez M Inioluwa Baris Specialty Pharmacist

## 2023-12-21 NOTE — Progress Notes (Signed)
 Specialty Pharmacy Refill Coordination Note  THOMAS MABRY is a 68 y.o. male contacted today regarding refills of specialty medication(s) Etanercept  (Enbrel  SureClick)   Patient requested Delivery   Delivery date: 12/26/23   Verified address: [address]   [place] [address]   Medication will be filled on 12/25/23.

## 2023-12-25 ENCOUNTER — Other Ambulatory Visit: Payer: Self-pay

## 2024-01-09 ENCOUNTER — Other Ambulatory Visit: Payer: Self-pay

## 2024-01-09 ENCOUNTER — Other Ambulatory Visit: Payer: Self-pay | Admitting: Pharmacy Technician

## 2024-01-09 MED ORDER — ENBREL SURECLICK 50 MG/ML ~~LOC~~ SOAJ
SUBCUTANEOUS | 5 refills | Status: DC
  Filled 2024-01-09: qty 4, 28d supply, fill #0
  Filled 2024-02-12: qty 4, 28d supply, fill #1
  Filled 2024-03-07: qty 4, 28d supply, fill #2
  Filled 2024-03-31: qty 4, 28d supply, fill #3
  Filled 2024-05-05: qty 4, 28d supply, fill #4
  Filled 2024-06-03 – 2024-06-09 (×2): qty 4, 28d supply, fill #5

## 2024-01-09 NOTE — Progress Notes (Signed)
 Specialty Pharmacy Refill Coordination Note  Bobby Clay is a 68 y.o. male contacted today regarding refills of specialty medication(s) Etanercept  (Enbrel  SureClick)   Patient requested Delivery   Delivery date: 01/18/24   Verified address: [address], [place], [place] [address]   Medication will be filled on 01/17/24.  This fill date is pending response to refill request from provider. Patient is aware and if they have not received fill by intended date they must follow up with pharmacy.

## 2024-01-17 ENCOUNTER — Other Ambulatory Visit: Payer: Self-pay

## 2024-01-21 ENCOUNTER — Other Ambulatory Visit (HOSPITAL_COMMUNITY): Payer: Self-pay

## 2024-01-22 ENCOUNTER — Other Ambulatory Visit (HOSPITAL_COMMUNITY): Payer: Self-pay

## 2024-01-22 ENCOUNTER — Other Ambulatory Visit: Payer: Self-pay

## 2024-01-22 MED ORDER — FOLIC ACID 1 MG PO TABS
1.0000 mg | ORAL_TABLET | Freq: Every day | ORAL | 1 refills | Status: DC
  Filled 2024-01-22: qty 90, 90d supply, fill #0
  Filled 2024-04-21: qty 90, 90d supply, fill #1

## 2024-01-22 MED ORDER — LISINOPRIL-HYDROCHLOROTHIAZIDE 20-12.5 MG PO TABS
1.0000 | ORAL_TABLET | Freq: Every morning | ORAL | 3 refills | Status: AC
  Filled 2024-01-22: qty 90, 90d supply, fill #0
  Filled 2024-04-21: qty 90, 90d supply, fill #1
  Filled 2024-07-21: qty 90, 90d supply, fill #2

## 2024-01-29 ENCOUNTER — Other Ambulatory Visit (HOSPITAL_COMMUNITY): Payer: Self-pay

## 2024-01-29 ENCOUNTER — Other Ambulatory Visit: Payer: Self-pay

## 2024-02-05 ENCOUNTER — Other Ambulatory Visit: Payer: Self-pay

## 2024-02-12 ENCOUNTER — Other Ambulatory Visit: Payer: Self-pay | Admitting: Pharmacy Technician

## 2024-02-12 ENCOUNTER — Other Ambulatory Visit: Payer: Self-pay

## 2024-02-12 NOTE — Progress Notes (Signed)
 Specialty Pharmacy Refill Coordination Note  Bobby Clay is a 68 y.o. male contacted today regarding refills of specialty medication(s) Etanercept  (Enbrel  SureClick)   Patient requested Delivery   Delivery date: 02/14/24   Verified address: [address]  [place] [address]   Medication will be filled on 02/13/24.

## 2024-02-13 ENCOUNTER — Other Ambulatory Visit: Payer: Self-pay

## 2024-02-19 DIAGNOSIS — M0589 Other rheumatoid arthritis with rheumatoid factor of multiple sites: Secondary | ICD-10-CM | POA: Diagnosis not present

## 2024-02-19 DIAGNOSIS — Z111 Encounter for screening for respiratory tuberculosis: Secondary | ICD-10-CM | POA: Diagnosis not present

## 2024-03-07 ENCOUNTER — Other Ambulatory Visit: Payer: Self-pay

## 2024-03-07 NOTE — Progress Notes (Signed)
 Specialty Pharmacy Refill Coordination Note  Bobby Clay is a 68 y.o. male contacted today regarding refills of specialty medication(s) Etanercept  (Enbrel  SureClick)   Patient requested Delivery   Delivery date: 03/11/24   Verified address: [address]  [place] [address]   Medication will be filled on 03/10/24

## 2024-03-13 ENCOUNTER — Other Ambulatory Visit (HOSPITAL_COMMUNITY): Payer: Self-pay

## 2024-03-13 DIAGNOSIS — R300 Dysuria: Secondary | ICD-10-CM | POA: Diagnosis not present

## 2024-03-13 DIAGNOSIS — R350 Frequency of micturition: Secondary | ICD-10-CM | POA: Diagnosis not present

## 2024-03-13 MED ORDER — CIPROFLOXACIN HCL 500 MG PO TABS
500.0000 mg | ORAL_TABLET | Freq: Two times a day (BID) | ORAL | 0 refills | Status: AC
  Filled 2024-03-13: qty 28, 14d supply, fill #0

## 2024-03-31 ENCOUNTER — Other Ambulatory Visit: Payer: Self-pay

## 2024-04-10 ENCOUNTER — Other Ambulatory Visit (HOSPITAL_COMMUNITY): Payer: Self-pay

## 2024-04-10 ENCOUNTER — Other Ambulatory Visit: Payer: Self-pay

## 2024-04-11 ENCOUNTER — Other Ambulatory Visit: Payer: Self-pay

## 2024-04-11 NOTE — Progress Notes (Signed)
 Specialty Pharmacy Refill Coordination Note  Bobby Clay is a 68 y.o. male contacted today regarding refills of specialty medication(s) Etanercept  (Enbrel  SureClick)   Patient requested Delivery   Delivery date: 04/15/24   Verified address: [address]  [place] [address]   Medication will be filled on 04/14/24.

## 2024-04-14 ENCOUNTER — Other Ambulatory Visit: Payer: Self-pay

## 2024-04-21 ENCOUNTER — Other Ambulatory Visit (HOSPITAL_COMMUNITY): Payer: Self-pay

## 2024-04-21 ENCOUNTER — Other Ambulatory Visit: Payer: Self-pay

## 2024-04-21 MED ORDER — METHOTREXATE SODIUM 2.5 MG PO TABS
10.0000 mg | ORAL_TABLET | ORAL | 1 refills | Status: AC
  Filled 2024-04-21: qty 52, 91d supply, fill #0
  Filled 2024-07-21: qty 52, 91d supply, fill #1

## 2024-05-05 ENCOUNTER — Other Ambulatory Visit (HOSPITAL_COMMUNITY): Payer: Self-pay

## 2024-05-05 ENCOUNTER — Other Ambulatory Visit: Payer: Self-pay

## 2024-05-05 NOTE — Progress Notes (Signed)
 Specialty Pharmacy Refill Coordination Note  Bobby Clay is a 68 y.o. male contacted today regarding refills of specialty medication(s) Etanercept  (Enbrel  SureClick)   Patient requested Delivery   Delivery date: 05/14/24   Verified address: [address]  [place] [id number]   Medication will be filled on 05/13/24.

## 2024-05-20 DIAGNOSIS — M1991 Primary osteoarthritis, unspecified site: Secondary | ICD-10-CM | POA: Diagnosis not present

## 2024-05-20 DIAGNOSIS — Z6823 Body mass index (BMI) 23.0-23.9, adult: Secondary | ICD-10-CM | POA: Diagnosis not present

## 2024-05-20 DIAGNOSIS — M0589 Other rheumatoid arthritis with rheumatoid factor of multiple sites: Secondary | ICD-10-CM | POA: Diagnosis not present

## 2024-06-03 ENCOUNTER — Other Ambulatory Visit: Payer: Self-pay

## 2024-06-05 ENCOUNTER — Other Ambulatory Visit: Payer: Self-pay

## 2024-06-06 ENCOUNTER — Other Ambulatory Visit: Payer: Self-pay

## 2024-06-09 ENCOUNTER — Other Ambulatory Visit: Payer: Self-pay

## 2024-06-09 ENCOUNTER — Other Ambulatory Visit (HOSPITAL_COMMUNITY): Payer: Self-pay

## 2024-06-09 NOTE — Progress Notes (Signed)
 Specialty Pharmacy Refill Coordination Note  Bobby Clay is a 68 y.o. male contacted today regarding refills of specialty medication(s) Etanercept  (Enbrel  SureClick)   Patient requested Delivery   Delivery date: 06/11/24   Verified address: [address]   [place] [id number]   Medication will be filled on 06/10/24.

## 2024-06-12 DIAGNOSIS — E039 Hypothyroidism, unspecified: Secondary | ICD-10-CM | POA: Diagnosis not present

## 2024-06-12 DIAGNOSIS — I10 Essential (primary) hypertension: Secondary | ICD-10-CM | POA: Diagnosis not present

## 2024-07-01 ENCOUNTER — Other Ambulatory Visit (HOSPITAL_COMMUNITY): Payer: Self-pay

## 2024-07-01 DIAGNOSIS — Z23 Encounter for immunization: Secondary | ICD-10-CM | POA: Diagnosis not present

## 2024-07-03 ENCOUNTER — Other Ambulatory Visit: Payer: Self-pay

## 2024-07-03 ENCOUNTER — Other Ambulatory Visit (HOSPITAL_COMMUNITY): Payer: Self-pay

## 2024-07-03 MED ORDER — ENBREL SURECLICK 50 MG/ML ~~LOC~~ SOAJ
SUBCUTANEOUS | 0 refills | Status: DC
  Filled 2024-07-08: qty 4, 28d supply, fill #0

## 2024-07-08 ENCOUNTER — Other Ambulatory Visit: Payer: Self-pay

## 2024-07-08 NOTE — Progress Notes (Signed)
 Specialty Pharmacy Refill Coordination Note  Bobby Clay is a 68 y.o. male contacted today regarding refills of specialty medication(s) Etanercept  (Enbrel  SureClick)   Patient requested Delivery   Delivery date: 07/10/24   Verified address: [address]  [place] [id number]   Medication will be filled on: 07/09/24

## 2024-07-21 ENCOUNTER — Other Ambulatory Visit: Payer: Self-pay

## 2024-07-21 ENCOUNTER — Other Ambulatory Visit (HOSPITAL_COMMUNITY): Payer: Self-pay

## 2024-07-21 DIAGNOSIS — D400 Neoplasm of uncertain behavior of prostate: Secondary | ICD-10-CM | POA: Diagnosis not present

## 2024-07-21 DIAGNOSIS — Z8042 Family history of malignant neoplasm of prostate: Secondary | ICD-10-CM | POA: Diagnosis not present

## 2024-07-21 DIAGNOSIS — N411 Chronic prostatitis: Secondary | ICD-10-CM | POA: Diagnosis not present

## 2024-07-21 DIAGNOSIS — R972 Elevated prostate specific antigen [PSA]: Secondary | ICD-10-CM | POA: Diagnosis not present

## 2024-07-21 MED ORDER — ATORVASTATIN CALCIUM 40 MG PO TABS
20.0000 mg | ORAL_TABLET | Freq: Every day | ORAL | 2 refills | Status: AC
  Filled 2024-07-21: qty 45, 90d supply, fill #0

## 2024-07-22 ENCOUNTER — Other Ambulatory Visit: Payer: Self-pay

## 2024-07-22 ENCOUNTER — Other Ambulatory Visit (HOSPITAL_COMMUNITY): Payer: Self-pay

## 2024-07-22 ENCOUNTER — Other Ambulatory Visit: Payer: Self-pay | Admitting: Urology

## 2024-07-22 DIAGNOSIS — N411 Chronic prostatitis: Secondary | ICD-10-CM

## 2024-07-22 DIAGNOSIS — D400 Neoplasm of uncertain behavior of prostate: Secondary | ICD-10-CM

## 2024-07-22 DIAGNOSIS — Z8042 Family history of malignant neoplasm of prostate: Secondary | ICD-10-CM

## 2024-07-22 DIAGNOSIS — R972 Elevated prostate specific antigen [PSA]: Secondary | ICD-10-CM

## 2024-07-22 MED ORDER — FOLIC ACID 1 MG PO TABS
1.0000 mg | ORAL_TABLET | Freq: Every day | ORAL | 1 refills | Status: AC
  Filled 2024-07-22: qty 90, 90d supply, fill #0

## 2024-07-23 ENCOUNTER — Other Ambulatory Visit: Payer: Self-pay

## 2024-07-23 ENCOUNTER — Other Ambulatory Visit (HOSPITAL_COMMUNITY): Payer: Self-pay

## 2024-07-23 MED ORDER — CIPROFLOXACIN HCL 500 MG PO TABS
500.0000 mg | ORAL_TABLET | Freq: Two times a day (BID) | ORAL | 0 refills | Status: AC
  Filled 2024-07-23 (×2): qty 60, 30d supply, fill #0

## 2024-07-28 ENCOUNTER — Encounter: Payer: Self-pay | Admitting: Urology

## 2024-07-30 ENCOUNTER — Other Ambulatory Visit: Payer: Self-pay

## 2024-07-31 ENCOUNTER — Other Ambulatory Visit (HOSPITAL_COMMUNITY): Payer: Self-pay

## 2024-07-31 ENCOUNTER — Other Ambulatory Visit: Payer: Self-pay

## 2024-07-31 MED ORDER — ENBREL SURECLICK 50 MG/ML ~~LOC~~ SOAJ
50.0000 mg | SUBCUTANEOUS | 2 refills | Status: AC
  Filled 2024-07-31: qty 4, 28d supply, fill #0
  Filled 2024-08-26 – 2024-09-03 (×2): qty 4, 28d supply, fill #1
  Filled 2024-09-30: qty 4, 28d supply, fill #2

## 2024-07-31 NOTE — Progress Notes (Signed)
 Specialty Pharmacy Refill Coordination Note  Bobby Clay is a 68 y.o. male contacted today regarding refills of specialty medication(s) Etanercept  (Enbrel  SureClick)   Patient requested Delivery   Delivery date: 08/06/24   Verified address: [address]  [place] [id number]   Medication will be filled on: 08/05/24

## 2024-08-05 ENCOUNTER — Other Ambulatory Visit: Payer: Self-pay

## 2024-08-19 DIAGNOSIS — M0589 Other rheumatoid arthritis with rheumatoid factor of multiple sites: Secondary | ICD-10-CM | POA: Diagnosis not present

## 2024-08-26 ENCOUNTER — Other Ambulatory Visit: Payer: Self-pay

## 2024-09-03 ENCOUNTER — Other Ambulatory Visit: Payer: Self-pay

## 2024-09-03 NOTE — Progress Notes (Signed)
 Specialty Pharmacy Refill Coordination Note  Bobby Clay is a 69 y.o. male contacted today regarding refills of specialty medication(s) Etanercept  (Enbrel  SureClick)   Patient requested Delivery   Delivery date: 09/10/24   Verified address: [address]  [place] [id number]   Medication will be filled on: 09/09/24

## 2024-09-06 ENCOUNTER — Ambulatory Visit
Admission: RE | Admit: 2024-09-06 | Discharge: 2024-09-06 | Disposition: A | Source: Ambulatory Visit | Attending: Urology | Admitting: Urology

## 2024-09-06 DIAGNOSIS — R972 Elevated prostate specific antigen [PSA]: Secondary | ICD-10-CM

## 2024-09-06 DIAGNOSIS — D400 Neoplasm of uncertain behavior of prostate: Secondary | ICD-10-CM

## 2024-09-06 DIAGNOSIS — Z8042 Family history of malignant neoplasm of prostate: Secondary | ICD-10-CM

## 2024-09-06 DIAGNOSIS — N411 Chronic prostatitis: Secondary | ICD-10-CM

## 2024-09-06 MED ORDER — GADOPICLENOL 0.5 MMOL/ML IV SOLN
7.0000 mL | Freq: Once | INTRAVENOUS | Status: AC | PRN
  Administered 2024-09-06: 7 mL via INTRAVENOUS

## 2024-09-09 ENCOUNTER — Other Ambulatory Visit: Payer: Self-pay

## 2024-09-09 ENCOUNTER — Other Ambulatory Visit (HOSPITAL_COMMUNITY): Payer: Self-pay

## 2024-09-23 ENCOUNTER — Other Ambulatory Visit: Payer: Self-pay

## 2024-09-23 NOTE — Progress Notes (Signed)
 Specialty Pharmacy Ongoing Clinical Assessment Note  Bobby Clay is a 69 y.o. male who is being followed by the specialty pharmacy service for RxSp Rheumatoid Arthritis   Patient's specialty medication(s) reviewed today: Etanercept  (Enbrel  SureClick)   Missed doses in the last 4 weeks: 0   Patient/Caregiver did not have any additional questions or concerns.   Therapeutic benefit summary: Patient is achieving benefit   Adverse events/side effects summary: No adverse events/side effects   Patient's therapy is appropriate to: Continue    Goals Addressed             This Visit's Progress    Minimize recurrence of flares   On track    Patient is on track. Patient will maintain adherence.  Patient reports that he is well-controlled at this time.          Follow up: 12 months  [facility]

## 2024-09-30 ENCOUNTER — Other Ambulatory Visit (HOSPITAL_COMMUNITY): Payer: Self-pay
